# Patient Record
Sex: Female | Born: 1965 | Race: White | Hispanic: No | Marital: Married | Smoking: Never smoker
Health system: Southern US, Community
[De-identification: ages and names within clinical notes are randomized; demographics above are authoritative.]

## PROBLEM LIST (undated history)

## (undated) DIAGNOSIS — K219 Gastro-esophageal reflux disease without esophagitis: Secondary | ICD-10-CM

## (undated) DIAGNOSIS — E039 Hypothyroidism, unspecified: Secondary | ICD-10-CM

## (undated) DIAGNOSIS — F419 Anxiety disorder, unspecified: Secondary | ICD-10-CM

## (undated) DIAGNOSIS — G4730 Sleep apnea, unspecified: Secondary | ICD-10-CM

## (undated) DIAGNOSIS — Z9889 Other specified postprocedural states: Secondary | ICD-10-CM

## (undated) DIAGNOSIS — F32A Depression, unspecified: Secondary | ICD-10-CM

## (undated) DIAGNOSIS — R011 Cardiac murmur, unspecified: Secondary | ICD-10-CM

## (undated) DIAGNOSIS — R112 Nausea with vomiting, unspecified: Secondary | ICD-10-CM

## (undated) DIAGNOSIS — D649 Anemia, unspecified: Secondary | ICD-10-CM

## (undated) DIAGNOSIS — F329 Major depressive disorder, single episode, unspecified: Secondary | ICD-10-CM

## (undated) DIAGNOSIS — I10 Essential (primary) hypertension: Secondary | ICD-10-CM

## (undated) DIAGNOSIS — G5640 Causalgia of unspecified upper limb: Secondary | ICD-10-CM

## (undated) DIAGNOSIS — R51 Headache: Principal | ICD-10-CM

## (undated) HISTORY — PX: ABDOMINOPLASTY: SUR9

## (undated) HISTORY — PX: FRACTURE SURGERY: SHX138

## (undated) HISTORY — PX: OTHER SURGICAL HISTORY: SHX169

## (undated) HISTORY — PX: NOSE SURGERY: SHX723

## (undated) HISTORY — PX: RECTOCELE REPAIR: SHX761

## (undated) HISTORY — PX: CARPAL TUNNEL RELEASE: SHX101

---

## 1998-02-24 ENCOUNTER — Inpatient Hospital Stay (HOSPITAL_COMMUNITY): Admission: RE | Admit: 1998-02-24 | Discharge: 1998-02-24 | Payer: Self-pay | Admitting: Obstetrics & Gynecology

## 1998-03-16 ENCOUNTER — Inpatient Hospital Stay (HOSPITAL_COMMUNITY): Admission: AD | Admit: 1998-03-16 | Discharge: 1998-03-16 | Payer: Self-pay | Admitting: Obstetrics and Gynecology

## 1998-04-30 ENCOUNTER — Other Ambulatory Visit: Admission: RE | Admit: 1998-04-30 | Discharge: 1998-04-30 | Payer: Self-pay | Admitting: *Deleted

## 1998-09-04 ENCOUNTER — Ambulatory Visit (HOSPITAL_COMMUNITY): Admission: RE | Admit: 1998-09-04 | Discharge: 1998-09-04 | Payer: Self-pay | Admitting: Obstetrics and Gynecology

## 1998-09-17 ENCOUNTER — Ambulatory Visit (HOSPITAL_COMMUNITY): Admission: RE | Admit: 1998-09-17 | Discharge: 1998-09-17 | Payer: Self-pay | Admitting: Obstetrics and Gynecology

## 1999-05-17 ENCOUNTER — Inpatient Hospital Stay (HOSPITAL_COMMUNITY): Admission: AD | Admit: 1999-05-17 | Discharge: 1999-05-17 | Payer: Self-pay | Admitting: Obstetrics and Gynecology

## 1999-06-14 ENCOUNTER — Inpatient Hospital Stay (HOSPITAL_COMMUNITY): Admission: AD | Admit: 1999-06-14 | Discharge: 1999-06-17 | Payer: Self-pay | Admitting: Obstetrics and Gynecology

## 1999-06-18 ENCOUNTER — Encounter (HOSPITAL_COMMUNITY): Admission: RE | Admit: 1999-06-18 | Discharge: 1999-09-16 | Payer: Self-pay | Admitting: Obstetrics and Gynecology

## 1999-07-18 ENCOUNTER — Other Ambulatory Visit: Admission: RE | Admit: 1999-07-18 | Discharge: 1999-07-18 | Payer: Self-pay | Admitting: Obstetrics and Gynecology

## 1999-10-03 HISTORY — PX: HERNIA REPAIR: SHX51

## 2000-02-23 ENCOUNTER — Encounter: Payer: Self-pay | Admitting: Specialist

## 2000-02-24 ENCOUNTER — Encounter (INDEPENDENT_AMBULATORY_CARE_PROVIDER_SITE_OTHER): Payer: Self-pay

## 2000-02-24 ENCOUNTER — Ambulatory Visit (HOSPITAL_COMMUNITY): Admission: RE | Admit: 2000-02-24 | Discharge: 2000-02-25 | Payer: Self-pay | Admitting: Specialist

## 2000-07-17 ENCOUNTER — Other Ambulatory Visit: Admission: RE | Admit: 2000-07-17 | Discharge: 2000-07-17 | Payer: Self-pay | Admitting: Obstetrics and Gynecology

## 2000-08-10 ENCOUNTER — Ambulatory Visit (HOSPITAL_BASED_OUTPATIENT_CLINIC_OR_DEPARTMENT_OTHER): Admission: RE | Admit: 2000-08-10 | Discharge: 2000-08-10 | Payer: Self-pay | Admitting: Otolaryngology

## 2001-07-19 ENCOUNTER — Other Ambulatory Visit: Admission: RE | Admit: 2001-07-19 | Discharge: 2001-07-19 | Payer: Self-pay | Admitting: Obstetrics and Gynecology

## 2002-01-16 ENCOUNTER — Observation Stay (HOSPITAL_COMMUNITY): Admission: RE | Admit: 2002-01-16 | Discharge: 2002-01-17 | Payer: Self-pay | Admitting: Obstetrics and Gynecology

## 2002-07-21 ENCOUNTER — Other Ambulatory Visit: Admission: RE | Admit: 2002-07-21 | Discharge: 2002-07-21 | Payer: Self-pay | Admitting: Obstetrics and Gynecology

## 2003-07-24 ENCOUNTER — Other Ambulatory Visit: Admission: RE | Admit: 2003-07-24 | Discharge: 2003-07-24 | Payer: Self-pay | Admitting: Obstetrics and Gynecology

## 2004-08-26 ENCOUNTER — Ambulatory Visit (HOSPITAL_COMMUNITY): Admission: RE | Admit: 2004-08-26 | Discharge: 2004-08-26 | Payer: Self-pay | Admitting: Gastroenterology

## 2005-07-14 ENCOUNTER — Encounter: Admission: RE | Admit: 2005-07-14 | Discharge: 2005-07-14 | Payer: Self-pay | Admitting: Gastroenterology

## 2006-10-04 ENCOUNTER — Encounter: Admission: RE | Admit: 2006-10-04 | Discharge: 2006-10-04 | Payer: Self-pay | Admitting: General Surgery

## 2007-02-19 ENCOUNTER — Encounter: Admission: RE | Admit: 2007-02-19 | Discharge: 2007-02-19 | Payer: Self-pay | Admitting: *Deleted

## 2007-09-13 ENCOUNTER — Encounter: Admission: RE | Admit: 2007-09-13 | Discharge: 2007-09-13 | Payer: Self-pay | Admitting: Family Medicine

## 2008-12-21 ENCOUNTER — Encounter: Admission: RE | Admit: 2008-12-21 | Discharge: 2008-12-21 | Payer: Self-pay | Admitting: Obstetrics and Gynecology

## 2008-12-24 ENCOUNTER — Encounter: Admission: RE | Admit: 2008-12-24 | Discharge: 2008-12-24 | Payer: Self-pay | Admitting: Obstetrics and Gynecology

## 2010-04-12 ENCOUNTER — Encounter: Admission: RE | Admit: 2010-04-12 | Discharge: 2010-04-12 | Payer: Self-pay | Admitting: Obstetrics and Gynecology

## 2011-02-17 NOTE — Op Note (Signed)
[facility]  Patient:    Natalie Camacho, Natalie Camacho Visit Number: [id number] MRN: [id number]          Service Type: DSU Location: 9300 [id number] [id number] Attending Physician:  Lenoard Aden Dictated by:   Lenoard Aden, M.D. Proc. Date: 01/16/02 Admit Date:  01/16/2002   CC:         [facility]   Operative Report  PREOPERATIVE DIAGNOSES:       1. Pelvic relaxation.                               2. Dyschezia.                               3. Rectocele.                               4. Perineal relaxation.  POSTOPERATIVE DIAGNOSES:      1. Pelvic relaxation.                               2. Dyschezia.                               3. Rectocele.                               4. Perineal relaxation.  OPERATION/PROCEDURE:          1. Posterior colporrhaphy.                               2. Perineorrhaphy.  SURGEON:                      Lenoard Aden, M.D.  ASSISTANT:                    Sung Amabile. Roslyn Smiling, M.D.  ANESTHESIA:                   General.  ESTIMATED BLOOD LOSS:         50 cc.  DRAINS:                       Vaginal pack and Foley catheter.  COMPLICATIONS:                None.  DISPOSITION:                  The patient to recovery in good condition.  DESCRIPTION OF PROCEDURE:     After being apprised of the risks of anesthesia, infection and bleeding, possible rectal injury with the need for repair, possible inability to cure the dyschezia and possible dyspareunia, the patient was brought to the operating room where she was administered a general anesthetic without complications and prepped and draped in the usual sterile fashion. After achieving adequate anesthesia, the apex of the rectocele was identified via rectal exam and clamped using Allis clamp. Dilute Pitressin solution was used to infiltrate the posterior vaginal wall and the perineum. A triangular shaped wedge of tissue is made in the perineum  and excised. The rectocele is  undermined creating a rectangular shaped defect in the posterior vaginal wall. This area is then closed using 2-0 Monocryl mattress sutures to collapse the posterior vaginal wall and imbricate the redundant rectal mucosa. After putting multiple interruptives to achieve rectocele closure, the perineorrhaphy is performed in standard fashion using a 3-0 Vicryl Rapide. Good hemostasis is achieved. The patient tolerated the procedure well. Rectal exam performed reveals no evidence of suture material in the rectum and good posterior rectal support. The patient is transported to recovery in good condition. Dictated by:   Lenoard Aden, M.D. Attending Physician:  Lenoard Aden DD:  01/16/02 TD:  01/17/02 Job: [id number]

## 2011-02-17 NOTE — Discharge Summary (Signed)
[facility]  Patient:    Natalie Camacho, Natalie Camacho Visit Number: [id number] MRN: [id number]          Service Type: DSU Location: 9300 [id number] [id number] Attending Physician:  Lenoard Aden Dictated by:   Lenoard Aden, M.D. Admit Date:  01/16/2002 Discharge Date: 01/17/2002                             Discharge Summary  HOSPITAL COURSE:              The patient underwent uncomplicated posterior colporrhaphy and perineorrhaphy on January 16, 2002.  Postoperative course uncomplicated.  Tolerated a regular diet without difficulty.  Hemoglobin 10.9. Discharged to home on day one.  Restrictions were discussed.  Discharge teaching was done.  Follow up in the office in four to six weeks. Dictated by:   Lenoard Aden, M.D. Attending Physician:  Lenoard Aden DD:  03/03/02 TD:  03/05/02 Job: [id number]

## 2011-02-17 NOTE — Op Note (Signed)
[place]. [facility]  Patient:    Natalie Camacho, Natalie Camacho                     MRN: [id number] Proc. Date: 02/24/00 Adm. Date:  [id number] Disc. Date: [id number] Attending:  Gustavus Messing CC:         Yaakov Guthrie. Shon Hough, M.D. x 2                           Operative Report  PREOPERATIVE DIAGNOSIS:  POSTOPERATIVE DIAGNOSIS:  PROCEDURE:  Repair of diastasis and limited abdominoplasty.  SURGEON:  Yaakov Guthrie. Shon Hough, M.D.  ANESTHESIA:  General.  INDICATIONS:  A 45 year old lady with abdominal dermatochalasis of lower abdomen, right and left ischial areas.  DESCRIPTION OF PROCEDURE:  The patient underwent general anesthesia and intubated orally.  A W-plasty incision was made down to underlying skin and subcutaneous tissue, and dissection carried down to umbilicus.  Severe diastasis was observed from the belly button down to the suprapubic area. This repaired back with multiple sutures of #1 Prolene suture.  The area was irrigated with saline and then the patient was placed in the jack knife position.  Excess tissue then removed.  Subcutaneous closure was done with 2-0 Vicryl x 2 layers, and then a running subcuticular stitch of 3-0 Monocryl. The wounds were cleansed, Steri-Strips were applied, and soft dressings.  The wounds were drained with a #10 Blake drain which were placed in the suprapubic area.  After soft dressings were applied, abdominoplasty support was placed.  She withstood the procedures very well and was taken to recovery in excellent condition. DD:  02/24/00 TD:  02/28/00 Job: [id number]

## 2011-02-17 NOTE — Op Note (Signed)
[place]. [facility]  Patient:    Natalie Camacho, Natalie Camacho                     MRN: [id number] Proc. Date: 08/10/00 Adm. Date:  [id number] Attending:  Lucky Cowboy CC:         Ammie Dalton, M.D.   Operative Report  PREOPERATIVE DIAGNOSIS:  Bilateral inferior turbinate hypertrophy.  POSTOPERATIVE DIAGNOSIS:  Bilateral inferior turbinate hypertrophy.  PROCEDURE:  Bilateral inferior turbinate reductions.  SURGEON:  Lucky Cowboy, M.D.  ANESTHESIA:  General endotracheal anesthesia.  ESTIMATED BLOOD LOSS:  30 cc.  SPECIMENS:  None.  COMPLICATIONS:  None.  INDICATIONS:  This patient is a 45 year old female who has tried medical therapy, including nasal steroid spray for nasal obstruction.  The patient was noted to have significant bilateral inferior turbinate hypertrophy.  This failed to improve with medical therapy, and for this reason the turbinate reductions are performed.  FINDINGS:  The patient was noted to have perfuse primarily mucosal inferior turbinate hypertrophy with some bony component.  There was a left anterior inferior septal deviation along the maxillary crest.  Nasal patency was achieved after reducing the turbinate, despite this deflection.  DESCRIPTION OF PROCEDURE:  The patient was taken to the operating room and placed on the table in the supine position.  She was then placed under general endotracheal anesthesia, and the septal cavity decongested with Afrin and lidocaine, and 1% lidocaine with 1:100,000 epinephrine was then used to inject both inferior turbinates.  The #15 blade was used to incise the inferior portion of the inferior turbinate after allowing time for topical and injection anesthesia and epinephrine effect.  Flaps were elevated medially and laterally, and bone was resected using the _______-Cut forceps.  The sinus scissors were then used to reduce the redundant mucosa.  Suction cautery was used for hemostasis.  The  right turbinate was removed in an identical fashion. The nasal cavity was then suctioned out.  A 0-degree _____ Hopkins endoscope was used for the procedure.  Each nasal cavity was then packed with a Merocel pack coated with Bactroban ointment.  The oral cavity was suctioned.  Please note that the procedure was performed with the table rotated counter clockwise 90 degrees.  Also note that the nose was prepped with Betadine and the patient draped in a sterile fashion prior to proceeding with the procedure.  The patient was awakened from anesthesia and extubated in the operating room. She was taken to the post anesthesia care unit in stable condition.  There were no complications. DD:  08/10/00 TD:  08/11/00 Job: [id number]

## 2011-02-17 NOTE — Op Note (Signed)
[place]. [facility]  Patient:    Natalie Camacho, Natalie Camacho                     MRN: [id number] Proc. Date: 02/24/00 Adm. Date:  [id number] Disc. Date: [id number] Attending:  Gustavus Messing CC:         Yaakov Guthrie. Shon Hough, M.D. x 2                           Operative Report  PREOPERATIVE DIAGNOSIS:  POSTOPERATIVE DIAGNOSIS:  PROCEDURE:  Exploration and repair of severe diastasis, as well as ventral hernia repair.  SURGEON:  Yaakov Guthrie. Shon Hough, M.D.  ANESTHESIA:  General.  INDICATIONS:  This is a 45 year old lady who is status post previous fundoplication for GERD.  She has done fairly well since that period of time, but now has developed in the upper abdomen, above the umbilicus, a bulge area with increased discomfort.  They are transient up towards the xiphoid process as well.  DESCRIPTION OF PROCEDURE:  Preoperatively, the patient was drawn for the area. She underwent general anesthesia and intubated orally.  Prep was done to the abdominal area and groin using Betadine soap and solution, and walled off with sterile towels and drapes so as to make a sterile field.  The patient also had severe scar cicatrix involving the midline of the abdomen from the umbilicus to the xiphoid that is going to be revised.  The scar was excised down to underlying subcutaneous tissue, down to underlying fascia.  Dissection was then carried out laterally on each side to expose the area.  The bulge area measured approximately 6 x 3 inches that was very obvious.  Also had increased diastasis all the way to the xiphoid process.  A piece of Prolene suture was placed over the defect for repair, and then multiple sutures of 2-0 Surgilon were used to repair the diastasis, as well as the herniation.  Irrigation was done with bug juice.  The incisions were then brought back together with 2-0 Monocryl x 2 layers in the subcutaneous planes, and a running subcuticular stitch of 3-0  Monocryl.  The wounds were drained with a #10 Blake drain which was placed in the depth of the wound and brought out through the umbilical area and secured with 3-0 Prolene.  Steri-Strips and a soft dressing were applied to all the areas.  She withstood the procedures very well.  Will put her in overnight stay for observation. DD:  02/24/00 TD:  02/28/00 Job: [id number]

## 2011-02-17 NOTE — H&P (Signed)
[facility]  Patient:    Natalie Camacho, Natalie Camacho Visit Number: [id number] MRN: [id number]          Service Type: DSU Location: 9300 9399 [id number] Attending Physician:  Lenoard Aden Dictated by:   Lenoard Aden, M.D. Admit Date:  01/16/2002                           History and Physical  CHIEF COMPLAINT:              Symptomatic pelvic relaxation.  HISTORY OF PRESENT ILLNESS:   The patient is a 45 year old white female, G1, P1, who presents with symptomatic pelvic pressure and dyschezia.  MEDICATIONS:                  Synthroid, Advair, Paxil, Wellbutrin, Singulair, multivitamin, Nexium, and Imitrex.  ALLERGIES:                    ASPIRIN, ERYTHROMYCIN, and CODEINE.  SOCIAL HISTORY:               She is a nonsmoker, nondrinker.  Denies domestic or physical violence.  PAST MEDICAL HISTORY:         Medical problems to include asthma, migraine headaches, premenstrual dysphoria, hypothyroidism, and alopecia.  PHYSICAL EXAMINATION:  GENERAL:                      She is a well-developed, well-nourished white female in no apparent distress.  HEENT:                        Normal.  CHEST:                        Lungs clear.  CARDIAC:                      Regular rate and rhythm.  ABDOMEN:                      Soft, nontender.  BREASTS:                      Patient is status post mammary augmentation and abdominoplasty.  PELVIC:                       The perineum reveals a small but asymptomatic cystocele, grade 1-2 rectocele, with perineal relaxation.  The uterus is well-supported.  No adnexal masses.  IMPRESSION:                   Symptomatic pelvic relaxation.  PLAN:                         Proceed with rectocele repair, perineorrhaphy. Risks of recurrence discussed.  Risks of anesthesia, infection, bleeding, injury to rectum with need to repair were discussed.  Patient acknowledges and desires to proceed.  The possibility of dyspareunia  postoperatively discussed. The patient acknowledges and desires to proceed. Dictated by:   Lenoard Aden, M.D. Attending Physician:  Lenoard Aden DD:  01/16/02 TD:  01/16/02 Job: [id number]

## 2011-02-17 NOTE — Discharge Summary (Signed)
[facility]  Patient:    Natalie Camacho, Natalie Camacho Visit Number: [id number] MRN: [id number]          Service Type: DSU Location: [phone] Attending Physician:  Lenoard Aden Dictated by:   Lenoard Aden, M.D. Admit Date:  01/16/2002 Discharge Date: 01/17/2002                             Discharge Summary  SUMMARY:                      The patient underwent an uncomplicated posterior colporrhaphy and perineorrhaphy on January 16, 2002. She tolerated regular diet well. Hemoglobin and hematocrit were within normal limits. She was discharged to home on postoperative day #1.  DISCHARGE MEDICATIONS:        1. Percocet, #20, given for pain. Take one                                  p.o. q.6h.                               2. Patient to continue MiraLax to avoid                                  constipation.  WOUND CARE:                   Wound care discussed.  DISCHARGE INSTRUCTIONS:       Discharge teaching done.  DISCHARGE FOLLOWUP:           The patient is to follow up in the office in two weeks. Dictated by:   Lenoard Aden, M.D. Attending Physician:  Lenoard Aden DD:  01/17/02 TD:  01/18/02 Job: [id number]

## 2011-05-29 ENCOUNTER — Other Ambulatory Visit: Payer: Self-pay | Admitting: Obstetrics and Gynecology

## 2011-05-31 ENCOUNTER — Other Ambulatory Visit: Payer: Self-pay | Admitting: Obstetrics and Gynecology

## 2011-06-12 ENCOUNTER — Other Ambulatory Visit: Payer: Self-pay

## 2011-06-12 ENCOUNTER — Encounter (HOSPITAL_COMMUNITY): Payer: Self-pay

## 2011-06-12 ENCOUNTER — Encounter (HOSPITAL_COMMUNITY)
Admission: RE | Admit: 2011-06-12 | Discharge: 2011-06-12 | Disposition: A | Discharge status: Home / Self Care | Payer: 59 | Source: Ambulatory Visit | Attending: Obstetrics and Gynecology | Admitting: Obstetrics and Gynecology

## 2011-06-12 HISTORY — DX: Essential (primary) hypertension: I10

## 2011-06-12 HISTORY — DX: Anemia, unspecified: D64.9

## 2011-06-12 HISTORY — DX: Hypothyroidism, unspecified: E03.9

## 2011-06-12 HISTORY — DX: Sleep apnea, unspecified: G47.30

## 2011-06-12 HISTORY — DX: Major depressive disorder, single episode, unspecified: F32.9

## 2011-06-12 HISTORY — DX: Other specified postprocedural states: Z98.890

## 2011-06-12 HISTORY — DX: Depression, unspecified: F32.A

## 2011-06-12 HISTORY — DX: Cardiac murmur, unspecified: R01.1

## 2011-06-12 HISTORY — DX: Anxiety disorder, unspecified: F41.9

## 2011-06-12 HISTORY — DX: Gastro-esophageal reflux disease without esophagitis: K21.9

## 2011-06-12 HISTORY — DX: Nausea with vomiting, unspecified: R11.2

## 2011-06-12 LAB — CBC
HCT: 37.6 % (ref 36.0–46.0)
MCH: 26.8 pg (ref 26.0–34.0)
MCHC: 31.9 g/dL (ref 30.0–36.0)
Platelets: 408 10*3/uL — ABNORMAL HIGH (ref 150–400)
RDW: 13.5 % (ref 11.5–15.5)

## 2011-06-12 LAB — BASIC METABOLIC PANEL
BUN: 12 mg/dL (ref 6–23)
CO2: 32 mEq/L (ref 19–32)
Calcium: 9.3 mg/dL (ref 8.4–10.5)
Chloride: 98 mEq/L (ref 96–112)
Creatinine, Ser: 0.98 mg/dL (ref 0.50–1.10)
Potassium: 3 mEq/L — ABNORMAL LOW (ref 3.5–5.1)
Sodium: 138 mEq/L (ref 135–145)

## 2011-06-12 NOTE — Patient Instructions (Signed)
20 Natalie Camacho [facility]  06/12/2011   Your procedure is scheduled on:  06/16/11  Report to [facility] at 1:15 PM.  Call this number if you have problems the morning of surgery: [phone]   Remember:   Do not eat food:After Midnight.  Do not drink clear liquids after 1030am on Fri  Take these medicines the morning of surgery with A SIP OF WATER: all am meds   Do not wear jewelry, make-up or nail polish.  Do not wear lotions, powders, or perfumes. You may wear deodorant.  Do not shave 48 hours prior to surgery.  Do not bring valuables to the hospital.  Contacts, dentures or bridgework may not be worn into surgery.  Leave suitcase in the car. After surgery it may be brought to your room.  For patients admitted to the hospital, checkout time is 11:00 AM the day of discharge.   Patients discharged the day of surgery will not be allowed to drive home.  Name and phone number of your driver: motherCorrie Dandy- [phone]   Special Instructions: CHG Shower Use Special Wash: 1/2 bottle night before surgery and 1/2 bottle morning of surgery.   Please read over the following fact sheets that you were given:

## 2011-06-13 NOTE — Pre-Procedure Instructions (Signed)
K+ result of 3.0 reported to Dr. Sheral Apley, who said to instruct pt to eat bananas daily until surgery. I phoned pt and she is allergic to bananas so I suggested orange juice and pt will look online for other options.

## 2011-06-16 ENCOUNTER — Ambulatory Visit (HOSPITAL_COMMUNITY)
Admission: RE | Admit: 2011-06-16 | Discharge: 2011-06-16 | Disposition: A | Discharge status: Home / Self Care | Payer: 59 | Source: Ambulatory Visit | Attending: Obstetrics and Gynecology | Admitting: Obstetrics and Gynecology

## 2011-06-16 ENCOUNTER — Other Ambulatory Visit: Payer: Self-pay | Admitting: Obstetrics and Gynecology

## 2011-06-16 ENCOUNTER — Encounter (HOSPITAL_COMMUNITY)
Admission: RE | Disposition: A | Discharge status: Home / Self Care | Payer: Self-pay | Source: Ambulatory Visit | Attending: Obstetrics and Gynecology

## 2011-06-16 ENCOUNTER — Encounter (HOSPITAL_COMMUNITY): Payer: Self-pay

## 2011-06-16 ENCOUNTER — Ambulatory Visit (HOSPITAL_COMMUNITY): Payer: 59 | Admitting: Anesthesiology

## 2011-06-16 ENCOUNTER — Encounter (HOSPITAL_COMMUNITY): Payer: Self-pay | Admitting: Anesthesiology

## 2011-06-16 DIAGNOSIS — Z01818 Encounter for other preprocedural examination: Secondary | ICD-10-CM | POA: Insufficient documentation

## 2011-06-16 DIAGNOSIS — N921 Excessive and frequent menstruation with irregular cycle: Secondary | ICD-10-CM

## 2011-06-16 DIAGNOSIS — N938 Other specified abnormal uterine and vaginal bleeding: Secondary | ICD-10-CM | POA: Insufficient documentation

## 2011-06-16 DIAGNOSIS — N949 Unspecified condition associated with female genital organs and menstrual cycle: Secondary | ICD-10-CM | POA: Insufficient documentation

## 2011-06-16 DIAGNOSIS — Z01812 Encounter for preprocedural laboratory examination: Secondary | ICD-10-CM | POA: Insufficient documentation

## 2011-06-16 SURGERY — DILATATION & CURETTAGE/HYSTEROSCOPY WITH NOVASURE ABLATION
Anesthesia: Choice | Wound class: Clean Contaminated

## 2011-06-16 MED ORDER — LIDOCAINE HCL (CARDIAC) 20 MG/ML IV SOLN
INTRAVENOUS | Status: DC | PRN
  Administered 2011-06-16: 80 mg via INTRAVENOUS

## 2011-06-16 MED ORDER — OXYCODONE-ACETAMINOPHEN 5-325 MG PO TABS
ORAL_TABLET | ORAL | Status: AC
  Filled 2011-06-16: qty 2

## 2011-06-16 MED ORDER — FENTANYL CITRATE 0.05 MG/ML IJ SOLN
25.0000 ug | INTRAMUSCULAR | Status: DC | PRN
  Administered 2011-06-16: 50 ug via INTRAVENOUS

## 2011-06-16 MED ORDER — LIDOCAINE HCL (CARDIAC) 20 MG/ML IV SOLN
INTRAVENOUS | Status: AC
  Filled 2011-06-16: qty 5

## 2011-06-16 MED ORDER — BUPIVACAINE HCL (PF) 0.25 % IJ SOLN
INTRAMUSCULAR | Status: DC | PRN
  Administered 2011-06-16: 20 mL

## 2011-06-16 MED ORDER — LACTATED RINGERS IV SOLN
INTRAVENOUS | Status: DC
Start: 2011-06-16 — End: 2011-06-16
  Administered 2011-06-16: 1000 mL via INTRAVENOUS
  Administered 2011-06-16 (×2): via INTRAVENOUS

## 2011-06-16 MED ORDER — LACTATED RINGERS IR SOLN: Status: DC | PRN

## 2011-06-16 MED ORDER — FENTANYL CITRATE 0.05 MG/ML IJ SOLN
INTRAMUSCULAR | Status: AC
  Filled 2011-06-16: qty 5

## 2011-06-16 MED ORDER — ONDANSETRON HCL 4 MG/2ML IJ SOLN
INTRAMUSCULAR | Status: AC
  Filled 2011-06-16: qty 2

## 2011-06-16 MED ORDER — PROPOFOL 10 MG/ML IV EMUL
INTRAVENOUS | Status: AC
  Filled 2011-06-16: qty 20

## 2011-06-16 MED ORDER — OXYCODONE-ACETAMINOPHEN 5-325 MG PO TABS
2.0000 | ORAL_TABLET | Freq: Once | ORAL | Status: AC
  Administered 2011-06-16: 2 via ORAL

## 2011-06-16 MED ORDER — SCOPOLAMINE 1 MG/3DAYS TD PT72
MEDICATED_PATCH | TRANSDERMAL | Status: AC
  Administered 2011-06-16: 1.5 mg via TRANSDERMAL
  Filled 2011-06-16: qty 1

## 2011-06-16 MED ORDER — ALBUTEROL SULFATE HFA 108 (90 BASE) MCG/ACT IN AERS
2.0000 | INHALATION_SPRAY | RESPIRATORY_TRACT | Status: DC | PRN
  Administered 2011-06-16: 2 via RESPIRATORY_TRACT
  Filled 2011-06-16: qty 6.7

## 2011-06-16 MED ORDER — FENTANYL CITRATE 0.05 MG/ML IJ SOLN
INTRAMUSCULAR | Status: DC | PRN
  Administered 2011-06-16: 100 ug via INTRAVENOUS
  Administered 2011-06-16: 50 ug via INTRAVENOUS

## 2011-06-16 MED ORDER — ALBUTEROL SULFATE HFA 108 (90 BASE) MCG/ACT IN AERS
INHALATION_SPRAY | RESPIRATORY_TRACT | Status: AC
  Administered 2011-06-16: 2 via RESPIRATORY_TRACT
  Filled 2011-06-16: qty 6.7

## 2011-06-16 MED ORDER — FENTANYL CITRATE 0.05 MG/ML IJ SOLN
INTRAMUSCULAR | Status: AC
  Administered 2011-06-16: 50 ug via INTRAVENOUS
  Filled 2011-06-16: qty 2

## 2011-06-16 MED ORDER — ALBUTEROL SULFATE HFA 108 (90 BASE) MCG/ACT IN AERS: 2.0000 | INHALATION_SPRAY | RESPIRATORY_TRACT | Status: DC | PRN

## 2011-06-16 MED ORDER — OXYCODONE-ACETAMINOPHEN 5-325 MG PO TABS: 1.0000 | ORAL_TABLET | ORAL | Status: DC | PRN

## 2011-06-16 MED ORDER — PROPOFOL 10 MG/ML IV EMUL
INTRAVENOUS | Status: DC | PRN
  Administered 2011-06-16: 200 mg via INTRAVENOUS
  Administered 2011-06-16: 100 mg via INTRAVENOUS

## 2011-06-16 MED ORDER — SCOPOLAMINE 1 MG/3DAYS TD PT72
1.0000 | MEDICATED_PATCH | TRANSDERMAL | Status: DC
  Administered 2011-06-16: 1.5 mg via TRANSDERMAL

## 2011-06-16 MED ORDER — ONDANSETRON HCL 4 MG/2ML IJ SOLN
INTRAMUSCULAR | Status: DC | PRN
  Administered 2011-06-16: 4 mg via INTRAVENOUS

## 2011-06-16 MED ORDER — OXYCODONE-ACETAMINOPHEN 5-325 MG PO TABS: 1.0000 | ORAL_TABLET | ORAL | Status: AC | PRN

## 2011-06-16 MED ORDER — MIDAZOLAM HCL 5 MG/5ML IJ SOLN
INTRAMUSCULAR | Status: DC | PRN
  Administered 2011-06-16: 2 mg via INTRAVENOUS

## 2011-06-16 MED ORDER — MIDAZOLAM HCL 2 MG/2ML IJ SOLN
INTRAMUSCULAR | Status: AC
  Filled 2011-06-16: qty 2

## 2011-06-16 SURGICAL SUPPLY — 14 items
ABLATOR ENDOMETRIAL BIPOLAR (ABLATOR) ×2 IMPLANT
CATH ROBINSON RED A/P 16FR (CATHETERS) ×2 IMPLANT
CLOTH BEACON ORANGE TIMEOUT ST (SAFETY) ×2 IMPLANT
CONTAINER PREFILL 10% NBF 60ML (FORM) ×4 IMPLANT
GLOVE BIO SURGEON STRL SZ7.5 (GLOVE) ×4 IMPLANT
GOWN PREVENTION PLUS LG XLONG (DISPOSABLE) ×2 IMPLANT
GOWN PREVENTION PLUS XLARGE (GOWN DISPOSABLE) ×2 IMPLANT
NDL SPNL 22GX3.5 QUINCKE BK (NEEDLE) ×1 IMPLANT
NEEDLE SPNL 22GX3.5 QUINCKE BK (NEEDLE) ×2 IMPLANT
PACK HYSTEROSCOPY LF (CUSTOM PROCEDURE TRAY) ×2 IMPLANT
PAD PREP 24X48 CUFFED NSTRL (MISCELLANEOUS) ×2 IMPLANT
SYR TB 1ML 25GX5/8 (SYRINGE) ×2 IMPLANT
TOWEL OR 17X24 6PK STRL BLUE (TOWEL DISPOSABLE) ×4 IMPLANT
WATER STERILE IRR 1000ML POUR (IV SOLUTION) ×2 IMPLANT

## 2011-06-16 NOTE — Transfer of Care (Signed)
Immediate Anesthesia Transfer of Care Note  Patient: Natalie Camacho  Procedure(s) Performed:  DILATATION & CURETTAGE/HYSTEROSCOPY WITH NOVASURE ABLATION  Patient Location: PACU  Anesthesia Type: General  Level of Consciousness: awake, alert  and oriented  Airway & Oxygen Therapy: Patient Spontanous Breathing and Patient connected to nasal cannula oxygen  Post-op Assessment: Report given to PACU RN  Post vital signs: stable  Complications: No apparent anesthesia complications

## 2011-06-16 NOTE — Anesthesia Preprocedure Evaluation (Addendum)
Anesthesia Evaluation  Name, MR# and DOB Patient awake  General Assessment Comment  Reviewed: Allergy & Precautions, H&P , Patient's Chart, lab work & pertinent test results, reviewed documented beta blocker date and time   History of Anesthesia Complications (+) PONV  Airway Mallampati: II TM Distance: >3 FB Neck ROM: full    Dental No notable dental hx.    Pulmonary  asthmasleep apnea  clear to auscultation  pulmonary exam normalPulmonary Exam Normal breath sounds clear to auscultation none    Cardiovascular Exercise Tolerance: Good hypertension, - Valvular Problems/Murmursregular Normal    Neuro/Psych    (+) PSYCHIATRIC DISORDERS, Anxiety, Depression,  Negative Neurological ROS  Negative Psych ROS  GI/Hepatic/Renal negative GI ROS  negative Liver ROS  negative Renal ROS   GERD Controlled     Endo/Other  Negative Endocrine ROS (+) Hypothyroidism,      Abdominal   Musculoskeletal   Hematology negative hematology ROS (+)   Peds  Reproductive/Obstetrics negative OB ROS    Anesthesia Other Findings            Anesthesia Physical Anesthesia Plan  ASA: III  Anesthesia Plan: General   Post-op Pain Management:    Induction:   Airway Management Planned: LMA  Additional Equipment:   Intra-op Plan:   Post-operative Plan:   Informed Consent: I have reviewed the patients History and Physical, chart, labs and discussed the procedure including the risks, benefits and alternatives for the proposed anesthesia with the patient or authorized representative who has indicated his/her understanding and acceptance.   Dental Advisory Given  Plan Discussed with: CRNA and Surgeon  Anesthesia Plan Comments:         Anesthesia Quick Evaluation

## 2011-06-16 NOTE — Op Note (Signed)
06/16/2011  2:58 PM  PATIENT:  Natalie Camacho  45 y.o. female  PRE-OPERATIVE DIAGNOSIS:  Menorrhagia  POST-OPERATIVE DIAGNOSIS:  Menorrhagia(Refractory)  PROCEDURE:  Procedure(s): DILATATION & CURETTAGE/HYSTEROSCOPY WITH NOVASURE ABLATION  SURGEON:  Surgeon(s): Lenoard Aden, MD  PHYSICIAN ASSISTANT:none   ASSISTANTS: none   ANESTHESIA:   local and general  ESTIMATED BLOOD LOSS: * No blood loss amount entered *   BLOOD ADMINISTERED:none  DRAINS: none   LOCAL MEDICATIONS USED:  MARCAINE 22CC  SPECIMEN:  Source of Specimen:  [facility]  DISPOSITION OF SPECIMEN:  PATHOLOGY  COUNTS:  YES  TOURNIQUET:  * No tourniquets in log *  DICTATION #: [id number]  PLAN OF CARE: DC home  PATIENT DISPOSITION:  PACU - hemodynamically stable.

## 2011-06-16 NOTE — Progress Notes (Signed)
  No changes noted. H&P dictated. 

## 2011-06-16 NOTE — Anesthesia Postprocedure Evaluation (Signed)
Anesthesia Post Note  Patient: Natalie Camacho [facility]  Procedure(s) Performed:  DILATATION & CURETTAGE/HYSTEROSCOPY WITH NOVASURE ABLATION  Anesthesia type: General  Patient location: PACU  Post pain: Pain level controlled  Post assessment: Post-op Vital signs reviewed  Last Vitals:  Filed Vitals:   06/16/11 1538  BP:   Pulse: 74  Temp:   Resp: 19    Post vital signs: Reviewed  Level of consciousness: sedated  Complications: No apparent anesthesia complications

## 2011-06-16 NOTE — H&P (Signed)
Natalie Camacho, Natalie Camacho              ACCOUNT NO.:  [id number]  MEDICAL RECORD NO.:  [id number]  LOCATION:                                 FACILITY:  PHYSICIAN:  Lenoard Aden, M.D.DATE OF BIRTH:  25-Jan-1966  DATE OF ADMISSION:  06/16/2011 DATE OF DISCHARGE:                             HISTORY & PHYSICAL   CHIEF COMPLAINT:  Dysfunctional uterine bleeding with no obvious structural lesions for definitive therapy.  HISTORY OF PRESENT ILLNESS:  A 45 year old white female G3 P1, who presents for evaluation of dysfunctional uterine bleeding with negative endometrial biopsy, normal labs, and also for definitive therapy.  ALLERGIES:  She has allergies to AUGMENTIN, ASPIRIN, CODEINE, and ERYTHROMYCIN.  Her medications include Valtrex as needed, Cymbalta, Vicodin as needed, triamterene, hydrochlorothiazide, Lasix, calcium, Protonix, ReQuip, Ambien, albuterol, Imitrex, Flexeril, ketoprofen, Synthroid, Neurontin, bupropion.  FAMILY HISTORY:  Hypertension, seizure disorder, lung cancer, COPD, heart disease, and Alzheimer disease.  She has a previous history of a vaginal delivery.  SURGICAL HISTORY:  Remarkable performed in May heart embolization in 2005, rectocele repair, abdominoplasty, carpal tunnel release.  PHYSICAL EXAMINATION:  GENERAL:  She is a well-developed, well- nourished, white female in no acute distress. HEENT:  Normal. NECK:  Supple.  Full range of motion. LUNGS:  Clear. HEART:  Regular rate and rhythm. ABDOMEN:  Soft and nontender. PELVIC:  Anteflexed uterus noted.  No adnexal masses.  IMPRESSION:  Dysfunctional uterine bleeding with negative workup for definitive therapy.  PLAN:  Proceed with diagnostic hysteroscopy, D and C with NovaSure endometrial ablation.  Risks of anesthesia, infection, bleeding, injury to abdominal organs and need for repair is discussed, delayed versus immediate complications to include bowel and bladder injury were notified.  The  patient acknowledges and wishes to proceed.     Lenoard Aden, M.D.     RJT/MEDQ  D:  06/15/2011  T:  06/15/2011  Job:  [id number]

## 2011-06-17 NOTE — Op Note (Signed)
Natalie Camacho, ZILBERMAN              ACCOUNT NO.:  [id number]  MEDICAL RECORD NO.:  [id number]  LOCATION:  [facility]                          FACILITY:  [facility]  PHYSICIAN:  Lenoard Aden, M.D.DATE OF BIRTH:  [birth year]  DATE OF PROCEDURE:  06/16/2011 DATE OF DISCHARGE:  06/16/2011                              OPERATIVE REPORT   DESCRIPTION OF PROCEDURE:  After being apprised of the risks of anesthesia, infection, bleeding, injury to abdominal organs and need for repair delayed versus immediate complications to include bowel and bladder injury, possible need for repair, inability to cure all bleeding, the patient was brought to the operating room.  She was administered general anesthetic without complications, prepped and draped in usual sterile fashion, catheterized till the bladder was empty.  Exam under anesthesia revealed a small mid positioned uterus and no adnexal masses at this time.  The speculum was placed.  Dilute Marcaine solution 22 mL total in a standard paracervical block.  Cervix easily dilated up to a #23 Pratt dilator.  Hysteroscope was placed. Visualization revealed a normal endometrial cavity with bilateral normal tubal ostia.  At this time endometrial curettings were collected in an usual four-quadrant method with a sharp curettage and minimal bleeding was noted.  The NovaSure device measurements were taken and the NovaSure was placed and seated in the appropriate fashion to a width of 4.4 and a length of 6.5.  The procedure was initiated after negative CO2 test for 70 seconds to a power of 157 watts.  At the termination of the procedure, the NovaSure device was removed, inspected and found to be without defects.  The endometrial cavity was also reinspected, defined to be well ablated and without evidence of endometrial or uterine perforation.  At this time the procedure was terminated, fluid deficit of 50 mL was noted.  The patient tolerated the procedure well and  was transferred to recovery in good condition.     Lenoard Aden, M.D.     RJT/MEDQ  D:  06/16/2011  T:  06/17/2011  Job:  [id number]

## 2011-08-18 ENCOUNTER — Other Ambulatory Visit: Payer: Self-pay | Admitting: Obstetrics and Gynecology

## 2011-08-18 DIAGNOSIS — Z1231 Encounter for screening mammogram for malignant neoplasm of breast: Secondary | ICD-10-CM

## 2011-09-19 ENCOUNTER — Ambulatory Visit
Admission: RE | Admit: 2011-09-19 | Discharge: 2011-09-19 | Disposition: A | Discharge status: Home / Self Care | Payer: 59 | Source: Ambulatory Visit | Attending: Obstetrics and Gynecology | Admitting: Obstetrics and Gynecology

## 2011-09-19 DIAGNOSIS — Z1231 Encounter for screening mammogram for malignant neoplasm of breast: Secondary | ICD-10-CM

## 2012-09-04 ENCOUNTER — Other Ambulatory Visit: Payer: Self-pay | Admitting: Obstetrics and Gynecology

## 2012-09-04 DIAGNOSIS — Z1231 Encounter for screening mammogram for malignant neoplasm of breast: Secondary | ICD-10-CM

## 2012-10-10 ENCOUNTER — Ambulatory Visit
Admission: RE | Admit: 2012-10-10 | Discharge: 2012-10-10 | Disposition: A | Discharge status: Home / Self Care | Payer: 59 | Source: Ambulatory Visit | Attending: Obstetrics and Gynecology | Admitting: Obstetrics and Gynecology

## 2012-10-10 DIAGNOSIS — Z1231 Encounter for screening mammogram for malignant neoplasm of breast: Secondary | ICD-10-CM

## 2012-11-26 ENCOUNTER — Encounter (HOSPITAL_COMMUNITY): Payer: Self-pay | Admitting: Emergency Medicine

## 2012-11-26 ENCOUNTER — Emergency Department (HOSPITAL_COMMUNITY): Payer: 59

## 2012-11-26 ENCOUNTER — Observation Stay (HOSPITAL_COMMUNITY)
Admission: EM | Admit: 2012-11-26 | Discharge: 2012-11-28 | Disposition: A | Discharge status: Home / Self Care | Payer: 59 | Attending: Internal Medicine | Admitting: Internal Medicine

## 2012-11-26 DIAGNOSIS — R51 Headache: Principal | ICD-10-CM | POA: Insufficient documentation

## 2012-11-26 DIAGNOSIS — E039 Hypothyroidism, unspecified: Secondary | ICD-10-CM | POA: Insufficient documentation

## 2012-11-26 DIAGNOSIS — I10 Essential (primary) hypertension: Secondary | ICD-10-CM | POA: Insufficient documentation

## 2012-11-26 DIAGNOSIS — F3289 Other specified depressive episodes: Secondary | ICD-10-CM | POA: Insufficient documentation

## 2012-11-26 DIAGNOSIS — E876 Hypokalemia: Secondary | ICD-10-CM | POA: Insufficient documentation

## 2012-11-26 DIAGNOSIS — Z23 Encounter for immunization: Secondary | ICD-10-CM | POA: Insufficient documentation

## 2012-11-26 DIAGNOSIS — F411 Generalized anxiety disorder: Secondary | ICD-10-CM | POA: Insufficient documentation

## 2012-11-26 DIAGNOSIS — Z79899 Other long term (current) drug therapy: Secondary | ICD-10-CM | POA: Insufficient documentation

## 2012-11-26 DIAGNOSIS — F329 Major depressive disorder, single episode, unspecified: Secondary | ICD-10-CM | POA: Insufficient documentation

## 2012-11-26 DIAGNOSIS — R197 Diarrhea, unspecified: Secondary | ICD-10-CM | POA: Insufficient documentation

## 2012-11-26 DIAGNOSIS — R195 Other fecal abnormalities: Secondary | ICD-10-CM

## 2012-11-26 DIAGNOSIS — R519 Headache, unspecified: Secondary | ICD-10-CM | POA: Diagnosis present

## 2012-11-26 DIAGNOSIS — K219 Gastro-esophageal reflux disease without esophagitis: Secondary | ICD-10-CM | POA: Insufficient documentation

## 2012-11-26 DIAGNOSIS — J45909 Unspecified asthma, uncomplicated: Secondary | ICD-10-CM | POA: Insufficient documentation

## 2012-11-26 HISTORY — DX: Headache: R51

## 2012-11-26 LAB — CBC WITH DIFFERENTIAL/PLATELET
Basophils Absolute: 0.1 10*3/uL (ref 0.0–0.1)
Basophils Relative: 0 % (ref 0–1)
HCT: 38.2 % (ref 36.0–46.0)
Hemoglobin: 13.4 g/dL (ref 12.0–15.0)
Lymphocytes Relative: 33 % (ref 12–46)
Monocytes Absolute: 1.1 10*3/uL — ABNORMAL HIGH (ref 0.1–1.0)
Monocytes Relative: 7 % (ref 3–12)
Neutro Abs: 9.5 10*3/uL — ABNORMAL HIGH (ref 1.7–7.7)
Neutrophils Relative %: 58 % (ref 43–77)
WBC: 16.2 10*3/uL — ABNORMAL HIGH (ref 4.0–10.5)

## 2012-11-26 LAB — BASIC METABOLIC PANEL
BUN: 16 mg/dL (ref 6–23)
CO2: 30 mEq/L (ref 19–32)
Chloride: 94 mEq/L — ABNORMAL LOW (ref 96–112)
Creatinine, Ser: 0.91 mg/dL (ref 0.50–1.10)
GFR calc Af Amer: 87 mL/min — ABNORMAL LOW (ref >90)
Potassium: 2 mEq/L — CL (ref 3.5–5.1)

## 2012-11-26 LAB — MAGNESIUM: Magnesium: 1.6 mg/dL (ref 1.5–2.5)

## 2012-11-26 MED ORDER — IOHEXOL 350 MG/ML SOLN
50.0000 mL | Freq: Once | INTRAVENOUS | Status: AC | PRN
  Administered 2012-11-26: 50 mL via INTRAVENOUS

## 2012-11-26 MED ORDER — SODIUM CHLORIDE 0.9 % IV BOLUS (SEPSIS)
1000.0000 mL | Freq: Once | INTRAVENOUS | Status: AC
  Administered 2012-11-26: 1000 mL via INTRAVENOUS

## 2012-11-26 MED ORDER — ACETAMINOPHEN 325 MG PO TABS
650.0000 mg | ORAL_TABLET | Freq: Once | ORAL | Status: AC
  Administered 2012-11-26: 650 mg via ORAL
  Filled 2012-11-26: qty 2

## 2012-11-26 MED ORDER — DIPHENHYDRAMINE HCL 50 MG/ML IJ SOLN
25.0000 mg | Freq: Once | INTRAMUSCULAR | Status: AC
  Administered 2012-11-26: 25 mg via INTRAVENOUS
  Filled 2012-11-26: qty 1

## 2012-11-26 MED ORDER — POTASSIUM CHLORIDE 10 MEQ/100ML IV SOLN
10.0000 meq | Freq: Once | INTRAVENOUS | Status: AC
  Administered 2012-11-26: 10 meq via INTRAVENOUS
  Filled 2012-11-26: qty 100

## 2012-11-26 MED ORDER — DEXAMETHASONE SODIUM PHOSPHATE 10 MG/ML IJ SOLN
10.0000 mg | Freq: Once | INTRAMUSCULAR | Status: AC
  Administered 2012-11-26: 10 mg via INTRAVENOUS
  Filled 2012-11-26: qty 1

## 2012-11-26 MED ORDER — KETOROLAC TROMETHAMINE 30 MG/ML IJ SOLN
30.0000 mg | Freq: Once | INTRAMUSCULAR | Status: AC
  Administered 2012-11-26: 30 mg via INTRAVENOUS
  Filled 2012-11-26: qty 1

## 2012-11-26 MED ORDER — POTASSIUM CHLORIDE CRYS ER 20 MEQ PO TBCR
40.0000 meq | EXTENDED_RELEASE_TABLET | Freq: Once | ORAL | Status: AC
  Administered 2012-11-26: 40 meq via ORAL
  Filled 2012-11-26: qty 2

## 2012-11-26 MED ORDER — PROCHLORPERAZINE EDISYLATE 5 MG/ML IJ SOLN
10.0000 mg | Freq: Once | INTRAMUSCULAR | Status: AC
  Administered 2012-11-26: 10 mg via INTRAVENOUS
  Filled 2012-11-26: qty 2

## 2012-11-26 NOTE — ED Notes (Signed)
Pt c/o generalized HA x 5 days; pt sts hx of migraine but this is more severe; pt sts some dizziness and blurry vision

## 2012-11-26 NOTE — ED Notes (Signed)
Potassium, 2.0 critical low. Walter from lab.

## 2012-11-26 NOTE — ED Provider Notes (Signed)
History     CSN: [id number]  Arrival date & time 11/26/12  1553   First MD Initiated Contact with Patient 11/26/12 1928      Chief Complaint  Patient presents with  . Headache    HPI Natalie Camacho is a 47 y.o. female who presented to the ED for concern of headache.  Reports that 4 days ago had sudden onset headache.  Moderate in severity.  Different than previous migraines.  Bilateral pressure feeling radiating to back of neck.  No neck stiffness.  No fevers.  No chills.  No weakness/numbness.  No seizures.  No speech difficulties.  No other symptoms.  Past Medical History  Diagnosis Date  . Heart murmur   . Asthma   . Sleep apnea   . Hypothyroidism   . Anemia   . Hypertension   . GERD (gastroesophageal reflux disease)   . Anxiety   . Depression   . PONV (postoperative nausea and vomiting)     Past Surgical History  Procedure Laterality Date  . Fracture surgery      ORIF rt ankle, tendon sheath removed  . Hernia repair  2001  . Rectocele repair    . Nose surgery    . Carpal tunnel release    . Fundal      fundal plication  . Abdominoplasty      History reviewed. No pertinent family history.  History  Substance Use Topics  . Smoking status: Never Smoker   . Smokeless tobacco: Not on file  . Alcohol Use: No    OB History   Grav Para Term Preterm Abortions TAB SAB Ect Mult Living                  Review of Systems  Constitutional: Negative for fever and chills.  HENT: Negative for congestion, rhinorrhea, neck pain and neck stiffness.   Respiratory: Negative for cough and shortness of breath.   Cardiovascular: Negative for chest pain.  Gastrointestinal: Negative for nausea, vomiting, abdominal pain, diarrhea and abdominal distention.  Endocrine: Negative for polyuria.  Genitourinary: Negative for dysuria.  Skin: Negative for rash.  Neurological: Negative for headaches.  Psychiatric/Behavioral: Negative.   All other systems reviewed and are  negative.    Allergies  Accolate; Aspirin; Augmentin; Codeine; Erythromycin; Lyrica; and Peanut-containing drug products  Home Medications   Current Outpatient Rx  Name  Route  Sig  Dispense  Refill  . ARIPiprazole (ABILIFY) 5 MG tablet   Oral   Take 5 mg by mouth daily.         . B Complex Vitamins (B COMPLEX 1) tablet   Oral   Take 1 tablet by mouth daily.           . cyclobenzaprine (FLEXERIL) 10 MG tablet   Oral   Take 10 mg by mouth 2 (two) times daily.         . DULoxetine (CYMBALTA) 60 MG capsule   Oral   Take 60 mg by mouth 2 (two) times daily.          . furosemide (LASIX) 20 MG tablet   Oral   Take 20 mg by mouth daily.           Marland Kitchen ibuprofen (ADVIL,MOTRIN) 200 MG tablet   Oral   Take 400 mg by mouth daily as needed. For pain         . levalbuterol (XOPENEX HFA) 45 MCG/ACT inhaler   Inhalation   Inhale 1-2 puffs  into the lungs every 4 (four) hours as needed for wheezing. For shortness of  Breath         . levothyroxine (SYNTHROID, LEVOTHROID) 200 MCG tablet   Oral   Take 200 mcg by mouth daily.         . pantoprazole (PROTONIX) 40 MG tablet   Oral   Take 40 mg by mouth every morning.           . potassium chloride SA (K-DUR,KLOR-CON) 20 MEQ tablet   Oral   Take 20 mEq by mouth 2 (two) times daily.         Marland Kitchen rOPINIRole (REQUIP) 3 MG tablet   Oral   Take 3 mg by mouth at bedtime.           . simvastatin (ZOCOR) 20 MG tablet   Oral   Take 20 mg by mouth at bedtime.           . triamterene-hydrochlorothiazide (MAXZIDE-25) 37.5-25 MG per tablet   Oral   Take 1 tablet by mouth every morning.           . zolpidem (AMBIEN) 10 MG tablet   Oral   Take 10 mg by mouth at bedtime.             BP 133/83  Pulse 87  Temp(Src) 98.3 F (36.8 C) (Oral)  Resp 17  SpO2 99%  Physical Exam  Nursing note and vitals reviewed. Constitutional: She is oriented to person, place, and time. She appears well-developed and  well-nourished. No distress.  HENT:  Head: Normocephalic and atraumatic.  Right Ear: External ear normal.  Left Ear: External ear normal.  Nose: Nose normal.  Mouth/Throat: Oropharynx is clear and moist. No oropharyngeal exudate.  Eyes: EOM are normal. Pupils are equal, round, and reactive to light.  Neck: Normal range of motion. Neck supple. No tracheal deviation present.  Cardiovascular: Normal rate.   Pulmonary/Chest: Effort normal and breath sounds normal. No stridor. No respiratory distress. She has no wheezes. She has no rales.  Abdominal: Soft. She exhibits no distension. There is no tenderness. There is no rebound.  Musculoskeletal: Normal range of motion.  Neurological: She is alert and oriented to person, place, and time. She has normal strength and normal reflexes. No cranial nerve deficit or sensory deficit. She displays a negative Romberg sign. Gait (ambulates with cane 2/2 previous orthopedic issues.) abnormal. Coordination normal. GCS eye subscore is 4. GCS verbal subscore is 5. GCS motor subscore is 6.  Skin: Skin is warm and dry. She is not diaphoretic.    ED Course  Procedures (including critical care time)  Labs Reviewed  CBC WITH DIFFERENTIAL - Abnormal; Notable for the following:    WBC 16.2 (*)    Neutro Abs 9.5 (*)    Lymphs Abs 5.4 (*)    Monocytes Absolute 1.1 (*)    All other components within normal limits  BASIC METABOLIC PANEL - Abnormal; Notable for the following:    Potassium 2.0 (*)    Chloride 94 (*)    Glucose, Bld 100 (*)    GFR calc non Af Amer 75 (*)    GFR calc Af Amer 87 (*)    All other components within normal limits  MAGNESIUM   No results found.   Date: 11/27/2012  Rate: 74  Rhythm: normal sinus rhythm  QRS Axis: normal  Intervals: normal  ST/T Wave abnormalities: normal  Conduction Disutrbances:none  Narrative Interpretation: NSR.  Normal EKG  Old EKG Reviewed: unchanged    1. Hypokalemia   2. Headache       MDM   Natalie Camacho is a 47 y.o. female who presents to the ED for concern of sudden onset headache that started 4 days ago.  CTA done to r/o SAH.  Negative.  No evidence of meningitis, stroke, or acute glaucoma.  Labs checked and showing potassium of 2.0.  Likely 2/2 patient not taking PO potassium in setting of diuretic use.  IV and PO potassium given.  No EKG changes.  Medicine consulted for admission.  Patient's headache much improved s/p migraine cocktail.  Patient admitted.        Arloa Koh, MD 11/27/12 4540  Arloa Koh, MD 11/27/12 9811

## 2012-11-26 NOTE — ED Notes (Signed)
EDP at bedside  

## 2012-11-26 NOTE — ED Notes (Signed)
Pt transported to CT ?

## 2012-11-26 NOTE — ED Notes (Signed)
EKG given to Dr. Lynelle Doctor. Copy placed in pt chart.

## 2012-11-27 ENCOUNTER — Encounter (HOSPITAL_COMMUNITY): Payer: Self-pay | Admitting: Anesthesiology

## 2012-11-27 DIAGNOSIS — E876 Hypokalemia: Secondary | ICD-10-CM

## 2012-11-27 DIAGNOSIS — R195 Other fecal abnormalities: Secondary | ICD-10-CM | POA: Diagnosis present

## 2012-11-27 DIAGNOSIS — R51 Headache: Secondary | ICD-10-CM | POA: Diagnosis present

## 2012-11-27 DIAGNOSIS — R519 Headache, unspecified: Secondary | ICD-10-CM | POA: Diagnosis present

## 2012-11-27 LAB — MAGNESIUM: Magnesium: 2.2 mg/dL (ref 1.5–2.5)

## 2012-11-27 LAB — BASIC METABOLIC PANEL
CO2: 23 mEq/L (ref 19–32)
Calcium: 8.7 mg/dL (ref 8.4–10.5)
Creatinine, Ser: 0.87 mg/dL (ref 0.50–1.10)
GFR calc non Af Amer: 79 mL/min — ABNORMAL LOW (ref >90)
Sodium: 136 mEq/L (ref 135–145)

## 2012-11-27 MED ORDER — SIMVASTATIN 20 MG PO TABS
20.0000 mg | ORAL_TABLET | Freq: Every day | ORAL | Status: DC
  Administered 2012-11-27 (×2): 20 mg via ORAL
  Filled 2012-11-27 (×3): qty 1

## 2012-11-27 MED ORDER — POTASSIUM CHLORIDE 10 MEQ/100ML IV SOLN
10.0000 meq | INTRAVENOUS | Status: DC
  Administered 2012-11-27: 10 meq via INTRAVENOUS
  Filled 2012-11-27 (×3): qty 100

## 2012-11-27 MED ORDER — KETOROLAC TROMETHAMINE 30 MG/ML IJ SOLN
30.0000 mg | Freq: Four times a day (QID) | INTRAMUSCULAR | Status: DC | PRN
  Administered 2012-11-27: 30 mg via INTRAVENOUS
  Filled 2012-11-27: qty 1

## 2012-11-27 MED ORDER — ARIPIPRAZOLE 5 MG PO TABS
5.0000 mg | ORAL_TABLET | Freq: Every day | ORAL | Status: DC
  Administered 2012-11-27 – 2012-11-28 (×2): 5 mg via ORAL
  Filled 2012-11-27 (×2): qty 1

## 2012-11-27 MED ORDER — ENOXAPARIN SODIUM 40 MG/0.4ML ~~LOC~~ SOLN
40.0000 mg | Freq: Every day | SUBCUTANEOUS | Status: DC
  Administered 2012-11-27 – 2012-11-28 (×2): 40 mg via SUBCUTANEOUS
  Filled 2012-11-27 (×2): qty 0.4

## 2012-11-27 MED ORDER — LEVOTHYROXINE SODIUM 200 MCG PO TABS
200.0000 ug | ORAL_TABLET | Freq: Every day | ORAL | Status: DC
  Administered 2012-11-27 – 2012-11-28 (×2): 200 ug via ORAL
  Filled 2012-11-27 (×3): qty 1

## 2012-11-27 MED ORDER — TRIAMTERENE-HCTZ 37.5-25 MG PO TABS
1.0000 | ORAL_TABLET | Freq: Every day | ORAL | Status: DC
  Filled 2012-11-27: qty 1

## 2012-11-27 MED ORDER — ROPINIROLE HCL 1 MG PO TABS
3.0000 mg | ORAL_TABLET | Freq: Every day | ORAL | Status: DC
  Administered 2012-11-27 (×2): 3 mg via ORAL
  Filled 2012-11-27 (×3): qty 3

## 2012-11-27 MED ORDER — POTASSIUM CHLORIDE CRYS ER 20 MEQ PO TBCR
40.0000 meq | EXTENDED_RELEASE_TABLET | Freq: Two times a day (BID) | ORAL | Status: DC
  Administered 2012-11-27: 40 meq via ORAL

## 2012-11-27 MED ORDER — ONDANSETRON HCL 4 MG/2ML IJ SOLN: 4.0000 mg | Freq: Four times a day (QID) | INTRAMUSCULAR | Status: DC | PRN

## 2012-11-27 MED ORDER — FUROSEMIDE 20 MG PO TABS
20.0000 mg | ORAL_TABLET | Freq: Every day | ORAL | Status: DC
  Filled 2012-11-27: qty 1

## 2012-11-27 MED ORDER — SODIUM CHLORIDE 0.9 % IJ SOLN
3.0000 mL | Freq: Two times a day (BID) | INTRAMUSCULAR | Status: DC
  Administered 2012-11-27 – 2012-11-28 (×3): 3 mL via INTRAVENOUS

## 2012-11-27 MED ORDER — PNEUMOCOCCAL VAC POLYVALENT 25 MCG/0.5ML IJ INJ
0.5000 mL | INJECTION | Freq: Once | INTRAMUSCULAR | Status: AC
  Administered 2012-11-27: 0.5 mL via INTRAMUSCULAR
  Filled 2012-11-27: qty 0.5

## 2012-11-27 MED ORDER — MAGNESIUM SULFATE 50 % IJ SOLN: 1.0000 g | Freq: Once | INTRAMUSCULAR | Status: DC

## 2012-11-27 MED ORDER — POTASSIUM CHLORIDE CRYS ER 20 MEQ PO TBCR
40.0000 meq | EXTENDED_RELEASE_TABLET | ORAL | Status: AC
  Administered 2012-11-27 (×3): 40 meq via ORAL
  Filled 2012-11-27 (×3): qty 2

## 2012-11-27 MED ORDER — PANTOPRAZOLE SODIUM 40 MG PO TBEC
40.0000 mg | DELAYED_RELEASE_TABLET | Freq: Every day | ORAL | Status: DC
  Administered 2012-11-27 – 2012-11-28 (×2): 40 mg via ORAL
  Filled 2012-11-27 (×2): qty 1

## 2012-11-27 MED ORDER — DULOXETINE HCL 60 MG PO CPEP
60.0000 mg | ORAL_CAPSULE | Freq: Two times a day (BID) | ORAL | Status: DC
  Administered 2012-11-27 – 2012-11-28 (×3): 60 mg via ORAL
  Filled 2012-11-27 (×5): qty 1

## 2012-11-27 MED ORDER — POTASSIUM CHLORIDE IN NACL 40-0.9 MEQ/L-% IV SOLN
INTRAVENOUS | Status: DC
  Administered 2012-11-27: 03:00:00 via INTRAVENOUS
  Filled 2012-11-27 (×3): qty 1000

## 2012-11-27 MED ORDER — POTASSIUM CHLORIDE CRYS ER 20 MEQ PO TBCR
20.0000 meq | EXTENDED_RELEASE_TABLET | Freq: Two times a day (BID) | ORAL | Status: DC
  Administered 2012-11-27: 20 meq via ORAL
  Filled 2012-11-27 (×3): qty 1

## 2012-11-27 MED ORDER — ONDANSETRON HCL 4 MG PO TABS: 4.0000 mg | ORAL_TABLET | Freq: Four times a day (QID) | ORAL | Status: DC | PRN

## 2012-11-27 MED ORDER — MAGNESIUM SULFATE IN D5W 10-5 MG/ML-% IV SOLN
1.0000 g | Freq: Once | INTRAVENOUS | Status: AC
  Administered 2012-11-27: 1 g via INTRAVENOUS
  Filled 2012-11-27: qty 100

## 2012-11-27 MED ORDER — MAGNESIUM SULFATE 40 MG/ML IJ SOLN
2.0000 g | Freq: Once | INTRAMUSCULAR | Status: AC
  Administered 2012-11-27: 2 g via INTRAVENOUS
  Filled 2012-11-27: qty 50

## 2012-11-27 MED ORDER — ZOLPIDEM TARTRATE 5 MG PO TABS
5.0000 mg | ORAL_TABLET | Freq: Every day | ORAL | Status: DC
  Administered 2012-11-27: 5 mg via ORAL
  Filled 2012-11-27: qty 1

## 2012-11-27 NOTE — Progress Notes (Signed)
Utilization review completed.  P.J. Meili Kleckley,RN,BSN Case Manager [phone]  

## 2012-11-27 NOTE — H&P (Signed)
History and Physical  Natalie Camacho [facility] ZOX:[id number] DOB: 1965-12-06 DOA: 11/26/2012  Referring physician: ER   PCP: Default, Provider, MD   Chief Complaint: Headache  HPI:  47 year old female with past medical history most significant for asthma, anemia, hypothyroidism, hypertension, reflux disease, anxiety, depression who came in with chief complaints of sudden onset headache. Patient said that the headache started about 4 days ago started as a sharp pain at the back of her head and was associated with dizziness. The headache has progressively gotten worse since last 4 days. Today when she came to the hospital it was 9/10. The headache starts from the back of her head and then move forward. Patient wakes up with headaches in the middle of the night. Exacerbated by standing, stress and relieved with ibuprofen and Tylenol. Headache is also associated with blurring of vision. Pain is currently described as 1/10. CT angiogram was ordered in the ER suggestive of no evidence of stroke or vascular abnormality. Blood work in the ER suggested severe hypokalemia with potassium of 2.0 and internal medicine team was called to admit the patient for observation. Patient's headache has resolved now.     Review of Systems:  Patient has been having loose bowel movements since last 4-5 days. 1-2 bowel movements every day. Patient denies any chest pain, shortness of breath, abdominal pain, weakness or numbness anywhere in the body, palpitations, wheezing, changes in bowel movements, changes in urinary habits, rashes.   Past Medical History  Diagnosis Date  . Heart murmur   . Asthma   . Sleep apnea   . Hypothyroidism   . Anemia   . Hypertension   . GERD (gastroesophageal reflux disease)   . Anxiety   . Depression   . PONV (postoperative nausea and vomiting)     Past Surgical History  Procedure Laterality Date  . Fracture surgery      ORIF rt ankle, tendon sheath removed  . Hernia repair  2001  .  Rectocele repair    . Nose surgery    . Carpal tunnel release    . Fundal      fundal plication  . Abdominoplasty      Social History:  reports that she has never smoked. She does not have any smokeless tobacco history on file. She reports that she does not drink alcohol or use illicit drugs.  Allergies  Allergen Reactions  . Accolate (Zafirlukast) Hives  . Aspirin     wheezing  . Augmentin (Amoxicillin-Pot Clavulanate) Hives and Nausea And Vomiting  . Codeine Nausea And Vomiting  . Erythromycin Nausea And Vomiting  . Lyrica (Pregabalin)     unknown  . Peanut-Containing Drug Products     Wheezing     History reviewed. No pertinent family history.   Prior to Admission medications   Medication Sig Start Date End Date Taking? Authorizing Provider  ARIPiprazole (ABILIFY) 5 MG tablet Take 5 mg by mouth daily.   Yes Historical Provider, MD  B Complex Vitamins (B COMPLEX 1) tablet Take 1 tablet by mouth daily.     Yes Historical Provider, MD  cyclobenzaprine (FLEXERIL) 10 MG tablet Take 10 mg by mouth 2 (two) times daily.   Yes Historical Provider, MD  DULoxetine (CYMBALTA) 60 MG capsule Take 60 mg by mouth 2 (two) times daily.    Yes Historical Provider, MD  furosemide (LASIX) 20 MG tablet Take 20 mg by mouth daily.     Yes Historical Provider, MD  ibuprofen (ADVIL,MOTRIN) 200 MG tablet  Take 400 mg by mouth daily as needed. For pain   Yes Historical Provider, MD  levalbuterol (XOPENEX HFA) 45 MCG/ACT inhaler Inhale 1-2 puffs into the lungs every 4 (four) hours as needed for wheezing. For shortness of  Breath   Yes Historical Provider, MD  levothyroxine (SYNTHROID, LEVOTHROID) 200 MCG tablet Take 200 mcg by mouth daily.   Yes Historical Provider, MD  pantoprazole (PROTONIX) 40 MG tablet Take 40 mg by mouth every morning.     Yes Historical Provider, MD  potassium chloride SA (K-DUR,KLOR-CON) 20 MEQ tablet Take 20 mEq by mouth 2 (two) times daily.   Yes Historical Provider, MD   rOPINIRole (REQUIP) 3 MG tablet Take 3 mg by mouth at bedtime.     Yes Historical Provider, MD  simvastatin (ZOCOR) 20 MG tablet Take 20 mg by mouth at bedtime.     Yes Historical Provider, MD  triamterene-hydrochlorothiazide (MAXZIDE-25) 37.5-25 MG per tablet Take 1 tablet by mouth every morning.     Yes Historical Provider, MD  zolpidem (AMBIEN) 10 MG tablet Take 10 mg by mouth at bedtime.     Yes Historical Provider, MD   Physical Exam: Filed Vitals:   11/26/12 2130 11/26/12 2145 11/26/12 2200 11/26/12 2330  BP: 107/65 112/73 114/70 112/56  Pulse: 75 65 80 77  Temp:      TempSrc:      Resp: 16 15 21 12   SpO2: 94% 98% 98% 97%   Physical Exam: General: Vital signs reviewed and noted. Well-developed, well-nourished, in no acute distress; alert, appropriate and cooperative throughout examination.  Head: Normocephalic, atraumatic.  Eyes: PERRL, EOMI, No signs of anemia or jaundince.  Nose: Mucous membranes moist, not inflammed, nonerythematous.  Throat: Oropharynx nonerythematous, no exudate appreciated.   Neck: No deformities, masses, or tenderness noted.Supple, No carotid Bruits, no JVD.  Lungs:  Normal respiratory effort. Clear to auscultation BL without crackles or wheezes.  Heart: RRR. S1 and S2 normal without gallop, murmur, or rubs.  Abdomen:  BS normoactive. Soft, Nondistended, non-tender.  No masses or organomegaly.  Extremities: No pretibial edema.  Neurologic: A&O X3, CN II - XII are grossly intact. Motor strength is 5/5 in the all 4 extremities, Sensations intact to light touch, Cerebellar signs negative.  Skin: No visible rashes, scars.     Wt Readings from Last 3 Encounters:  06/12/11 200 lb (90.719 kg)    Labs on Admission:  Basic Metabolic Panel:  Recent Labs Lab 11/26/12 1945 11/26/12 2216  NA 137  --   K 2.0*  --   CL 94*  --   CO2 30  --   GLUCOSE 100*  --   BUN 16  --   CREATININE 0.91  --   CALCIUM 9.3  --   MG  --  1.6    CBC:  Recent  Labs Lab 11/26/12 1945  WBC 16.2*  NEUTROABS 9.5*  HGB 13.4  HCT 38.2  MCV 81.1  PLT 358      Radiological Exams on Admission: CT angiogram of the head Negative study   EKG: Independently reviewed. 74 beats per minute, normal axis, normal intervals, no ST and T wave abnormalities   Principal Problem:   Hypokalemia Active Problems:   Headache   Loose bowel movements  anxiety and depression Reflux disease Hypothyroidism Asthma Anemia Hypertension  Assessment/Plan Patient is a 48 year old female with past medical history as noted above who was initially seen in ER for severe headache since last 4 days. CT angiogram  was done of the head to rule out subarachnoid hemorrhage and found to be negative. Patient was found to be hypokalemic during the lab work and hence it was decided to admit the patient as an observation overnight until her potassium is repleted. -Admit to telemetry as observation -Replete potassium 40 mEq in 1 L normal saline at 125 cc an hour -Replete magnesium 2 g -Most likely cause of hypokalemia seems to be the urinary loss from use of Lasix and GI loss loose bowel movements in last 4-5 days -Obtain basic metabolic profile in a.m. Continue home medications. Reevaluate in the morning for discharge.  Continue ketorolac 30 mg IV every 6 hours when necessary for headaches.   Code Status: Full code Family Communication: Patient updated at bedside as family not present Disposition Plan/Anticipated LOS: 1-2 days  Time spent: 70 minutes  Lars Mage, MD  [facility] Team 5  If 7PM-7AM, please contact night-coverage at [email or web address], password [facility] 11/27/2012, 1:07 AM

## 2012-11-27 NOTE — ED Provider Notes (Signed)
I  reviewed the resident's note and I agree with the findings and plan.     Nelia Shi, MD 11/27/12 [phone]

## 2012-11-27 NOTE — Progress Notes (Signed)
Patient ID: Natalie Camacho [facility]  female  QIH:[id number]    DOB: 1966-08-03    DOA: 11/26/2012  PCP: Default, Provider, MD  Assessment/Plan: Principal Problem:   Hypokalemia - Potassium still low at 2.8 this morning, patient refused to IV replacement due to burning sensation - Placed on oral potassium replacement, will recheck potassium later today - Hold Lasix, discontinue IV fluids, triamterene/ HCTZ  Active Problems:   Headache: Improving, no fevers, no neck stiffness  History of peripheral edema: Currently stable, DC IV fluids  DVT Prophylaxis:  Code Status:  Disposition: Hopefully tomorrow  Patient admitted this morning, H&P reviewed  Subjective: States headache is improving, felt tired, no fevers, chills or any neck stiffness  Objective: Weight change:   Intake/Output Summary (Last 24 hours) at 11/27/12 1220 Last data filed at 11/27/12 0900  Gross per 24 hour  Intake    600 ml  Output      0 ml  Net    600 ml   Blood pressure 100/61, pulse 86, temperature 97.6 F (36.4 C), temperature source Oral, resp. rate 18, height 5' 7.5" (1.715 m), weight 96.979 kg (213 lb 12.8 oz), SpO2 95.00%.  Physical Exam: General: Alert and awake, oriented x3, not in any acute distress. HEENT: anicteric sclera, pupils reactive to light and accommodation, EOMI, no neck stiffness CVS: S1-S2 clear, no murmur rubs or gallops Chest: clear to auscultation bilaterally, no wheezing, rales or rhonchi Abdomen: soft nontender, nondistended, normal bowel sounds, no organomegaly Extremities: no cyanosis, clubbing or edema noted bilaterally Neuro: Cranial nerves II-XII intact, no focal neurological deficits  Lab Results: Basic Metabolic Panel:  Recent Labs Lab 11/26/12 1945  11/27/12 0625  NA 137  --  136  K 2.0*  --  2.8*  CL 94*  --  101  CO2 30  --  23  GLUCOSE 100*  --  207*  BUN 16  --  18  CREATININE 0.91  --  0.87  CALCIUM 9.3  --  8.7  MG  --   < > 2.2  < > = values in this  interval not displayed. CBC:  Recent Labs Lab 11/26/12 1945  WBC 16.2*  NEUTROABS 9.5*  HGB 13.4  HCT 38.2  MCV 81.1  PLT 358   Cardiac Enzymes: No results found for this basename: CKTOTAL, CKMB, CKMBINDEX, TROPONINI,  in the last 168 hours BNP: No components found with this basename: POCBNP,  CBG: No results found for this basename: GLUCAP,  in the last 168 hours   Micro Results: No results found for this or any previous visit (from the past 240 hour(s)).  Studies/Results: Ct Angio Head W/cm &/or Wo Cm  11/27/2012  *RADIOLOGY REPORT*  Clinical Data:  Sudden onset of headache 5 days ago.  Dizziness and blurred vision.  CT ANGIOGRAPHY HEAD  Technique:  Multidetector CT imaging of the head was performed using the standard protocol during bolus administration of intravenous contrast.  Multiplanar CT image reconstructions including MIPs were obtained to evaluate the vascular anatomy.  Contrast: 50mL OMNIPAQUE IOHEXOL 350 MG/ML SOLN  Comparison:   None.  Findings:  The brain itself has a normal appearance without evidence of malformation, atrophy, old or acute infarction, mass lesion, hemorrhage, hydrocephalus or extra-axial collection.  Both internal carotid arteries are widely patent into the brain. The anterior and middle cerebral vessels are normal without proximal stenosis, aneurysm or vascular malformation.  Both vertebral arteries are patent with the left being dominant. There is slight irregularity of the  midportion of the basilar with narrowing no more than about 20%.  This could be due to an early manifestation of atherosclerotic disease or fibromuscular disease. Posterior circulation branch vessels are normal.  No venous pathology is seen.   Review of the MIP images confirms the above findings.  IMPRESSION: Normal examination with respect to the anterior circulation.  Mild irregularity of the basilar artery without flow-limiting stenosis. Differential diagnosis is early  atherosclerotic change versus mild fibromuscular irregularity.  No vascular lesion seen to explain the patient's described symptoms.  Normal appearance of the brain as well.   Original Report Authenticated By: Paulina Fusi, M.D.     Medications: Scheduled Meds: . ARIPiprazole  5 mg Oral Daily  . DULoxetine  60 mg Oral BID  . enoxaparin (LOVENOX) injection  40 mg Subcutaneous Daily  . levothyroxine  200 mcg Oral QAC breakfast  . magnesium sulfate 1 - 4 g bolus IVPB  1 g Intravenous Once  . pantoprazole  40 mg Oral Daily  . potassium chloride SA  40 mEq Oral Q4H  . rOPINIRole  3 mg Oral QHS  . simvastatin  20 mg Oral QHS  . sodium chloride  3 mL Intravenous Q12H  . zolpidem  5 mg Oral QHS      LOS: 1 day   RAI,RIPUDEEP M.D. [facility] 11/27/2012, 12:20 PM Pager: [phone]  If 7PM-7AM, please contact night-coverage [email or web address] Password [email or web address]

## 2012-11-28 DIAGNOSIS — R197 Diarrhea, unspecified: Secondary | ICD-10-CM

## 2012-11-28 DIAGNOSIS — R51 Headache: Secondary | ICD-10-CM

## 2012-11-28 LAB — BASIC METABOLIC PANEL
BUN: 16 mg/dL (ref 6–23)
CO2: 24 mEq/L (ref 19–32)
Calcium: 8.3 mg/dL — ABNORMAL LOW (ref 8.4–10.5)
Chloride: 109 mEq/L (ref 96–112)
Creatinine, Ser: 0.64 mg/dL (ref 0.50–1.10)
GFR calc Af Amer: >90 mL/min (ref >90)

## 2012-11-28 MED ORDER — POTASSIUM CHLORIDE CRYS ER 20 MEQ PO TBCR: 20.0000 meq | EXTENDED_RELEASE_TABLET | Freq: Two times a day (BID) | ORAL | Status: DC

## 2012-11-28 NOTE — Discharge Summary (Signed)
Physician Discharge Summary  Patient ID: Natalie Camacho MRN: [id number] DOB/AGE: 45-Oct-1967 45 y.o.  Admit date: 11/26/2012 Discharge date: 11/28/2012  Primary Care Physician:  Emeterio Reeve, MD  Discharge Diagnoses:    . Headache- likely migraine . Hypokalemia . Loose bowel movements  Hypertension   Consults:  None   Discharge Instructions: 1) Please stop Triamterene-HCTZ  2) Hold Lasix and potassium until you see your primary doctor on Monday, get BMET/renal panel on Monday.    Discharge Medications:   Medication List    STOP taking these medications       furosemide 20 MG tablet  Commonly known as:  LASIX     potassium chloride SA 20 MEQ tablet  Commonly known as:  K-DUR,KLOR-CON     triamterene-hydrochlorothiazide 37.5-25 MG per tablet  Commonly known as:  MAXZIDE-25      TAKE these medications       ARIPiprazole 5 MG tablet  Commonly known as:  ABILIFY  Take 5 mg by mouth daily.     B COMPLEX 1 tablet  Take 1 tablet by mouth daily.     cyclobenzaprine 10 MG tablet  Commonly known as:  FLEXERIL  Take 10 mg by mouth 2 (two) times daily.     DULoxetine 60 MG capsule  Commonly known as:  CYMBALTA  Take 60 mg by mouth 2 (two) times daily.     ibuprofen 200 MG tablet  Commonly known as:  ADVIL,MOTRIN  Take 400 mg by mouth daily as needed. For pain     levalbuterol 45 MCG/ACT inhaler  Commonly known as:  XOPENEX HFA  Inhale 1-2 puffs into the lungs every 4 (four) hours as needed for wheezing. For shortness of  Breath     levothyroxine 200 MCG tablet  Commonly known as:  SYNTHROID, LEVOTHROID  Take 200 mcg by mouth daily.     pantoprazole 40 MG tablet  Commonly known as:  PROTONIX  Take 40 mg by mouth every morning.     rOPINIRole 3 MG tablet  Commonly known as:  REQUIP  Take 3 mg by mouth at bedtime.     simvastatin 20 MG tablet  Commonly known as:  ZOCOR  Take 20 mg by mouth at bedtime.     zolpidem 10 MG tablet  Commonly known as:   AMBIEN  Take 10 mg by mouth at bedtime.         Brief H and P: For complete details please refer to admission H and P, but in brief 47 year old female with past medical history most significant for asthma, anemia, hypothyroidism, hypertension, reflux disease, anxiety, depression who came in with chief complaints of sudden onset headache. Patient said that the headache started about 4 days ago started as a sharp pain at the back of her head and was associated with dizziness. The headache had progressively gotten worse since last 4 days, started from the back of her head and then moved forward. Patient wakes up with headaches in the middle of the night. Exacerbated by standing, stress and relieved with ibuprofen and Tylenol. Headache is also associated with blurring of vision. Pain is currently described as 1/10. CT angiogram was ordered in the ER suggestive of no evidence of stroke or vascular abnormality. Blood work in the ER suggested severe hypokalemia with potassium of 2.0   Hospital Course:   Hypokalemia: likely being on Lasix, patient had run out of potassium tablets and ongoing diarrhea. She was placed on IV and oral potassium replacement.  K at time of DC is 3.7, Mag was also replaced. Lasix and triamterene/ HCTZ were held as patient's BP was borderline during hospitalization.   Headache: Improving, no fevers, no neck stiffness, likely migraine.    History of peripheral edema: Currently stable, holding lasix.    Day of Discharge BP 105/68  Pulse 80  Temp(Src) 97.6 F (36.4 C) (Oral)  Resp 18  Ht 5' 7.5" (1.715 m)  Wt 96.979 kg (213 lb 12.8 oz)  BMI 32.97 kg/m2  SpO2 97%  Physical Exam: General: Alert and awake oriented x3 not in any acute distress. HEENT: anicteric sclera, pupils reactive to light and accommodation CVS: S1-S2 clear no murmur rubs or gallops Chest: clear to auscultation bilaterally, no wheezing rales or rhonchi Abdomen: soft nontender, nondistended, normal  bowel sounds, no organomegaly Extremities: no cyanosis, clubbing or edema noted bilaterally Neuro: Cranial nerves II-XII intact, no focal neurological deficits   The results of significant diagnostics from this hospitalization (including imaging, microbiology, ancillary and laboratory) are listed below for reference.    LAB RESULTS: Basic Metabolic Panel:  Recent Labs Lab 11/27/12 0625 11/27/12 1824 11/28/12 0800  NA 136  --  141  K 2.8* 3.1* 3.7  CL 101  --  109  CO2 23  --  24  GLUCOSE 207*  --  154*  BUN 18  --  16  CREATININE 0.87  --  0.64  CALCIUM 8.7  --  8.3*  MG 2.2  --   --    CBC:  Recent Labs Lab 11/26/12 1945  WBC 16.2*  NEUTROABS 9.5*  HGB 13.4  HCT 38.2  MCV 81.1  PLT 358   Cardiac Enzymes: No results found for this basename: CKTOTAL, CKMB, CKMBINDEX, TROPONINI,  in the last 168 hours BNP: No components found with this basename: POCBNP,  CBG: No results found for this basename: GLUCAP,  in the last 168 hours  Significant Diagnostic Studies:  Ct Angio Head W/cm &/or Wo Cm  11/27/2012  *RADIOLOGY REPORT*  Clinical Data:  Sudden onset of headache 5 days ago.  Dizziness and blurred vision.  CT ANGIOGRAPHY HEAD  Technique:  Multidetector CT imaging of the head was performed using the standard protocol during bolus administration of intravenous contrast.  Multiplanar CT image reconstructions including MIPs were obtained to evaluate the vascular anatomy.  Contrast: 50mL OMNIPAQUE IOHEXOL 350 MG/ML SOLN  Comparison:   None.  Findings:  The brain itself has a normal appearance without evidence of malformation, atrophy, old or acute infarction, mass lesion, hemorrhage, hydrocephalus or extra-axial collection.  Both internal carotid arteries are widely patent into the brain. The anterior and middle cerebral vessels are normal without proximal stenosis, aneurysm or vascular malformation.  Both vertebral arteries are patent with the left being dominant. There is slight  irregularity of the midportion of the basilar with narrowing no more than about 20%.  This could be due to an early manifestation of atherosclerotic disease or fibromuscular disease. Posterior circulation branch vessels are normal.  No venous pathology is seen.   Review of the MIP images confirms the above findings.  IMPRESSION: Normal examination with respect to the anterior circulation.  Mild irregularity of the basilar artery without flow-limiting stenosis. Differential diagnosis is early atherosclerotic change versus mild fibromuscular irregularity.  No vascular lesion seen to explain the patient's described symptoms.  Normal appearance of the brain as well.   Original Report Authenticated By: Paulina Fusi, M.D.     2D ECHO:   Disposition and  Follow-up:     Discharge Orders   Future Orders Complete By Expires     Diet - low sodium heart healthy  As directed     Discharge instructions  As directed     Comments:      1) Please stop Triamterene-HCTZ 2) Hold Lasix and potassium until you see your primary doctor on Monday, get BMET/renal panel on Monday.    Increase activity slowly  As directed         DISPOSITION: home\  DIET: heart healthy diet  ACTIVITY: as tolerated    DISCHARGE FOLLOW-UP Follow-up Information   Follow up with Emeterio Reeve, MD. Schedule an appointment as soon as possible for a visit on 12/02/2012. (for labs and hospital follow-up, get BMET for potassium level on Monday)    Contact information:   [address] [place] [address] [phone]       Time spent on Discharge: 35 mins  Signed:   Irina Okelly M.D. [facility] 11/28/2012, 12:21 PM Pager: [phone]

## 2013-05-29 ENCOUNTER — Telehealth: Payer: Self-pay | Admitting: Internal Medicine

## 2013-05-29 NOTE — Telephone Encounter (Signed)
S/W PT IN RE TO NP APPT 09/15 @ 10:30 W/DR. CHISM REFERRING DR. SHARON WOLTERS DX- PERSISTENTLY ELEVATED WBC CT WELCOME PACKET MAILED.

## 2013-05-29 NOTE — Telephone Encounter (Signed)
C/D 05/29/13 for appt. 06/16/13 °

## 2013-06-16 ENCOUNTER — Ambulatory Visit (HOSPITAL_BASED_OUTPATIENT_CLINIC_OR_DEPARTMENT_OTHER): Payer: 59 | Admitting: Internal Medicine

## 2013-06-16 ENCOUNTER — Encounter: Payer: Self-pay | Admitting: Internal Medicine

## 2013-06-16 ENCOUNTER — Telehealth: Payer: Self-pay | Admitting: *Deleted

## 2013-06-16 ENCOUNTER — Ambulatory Visit (HOSPITAL_BASED_OUTPATIENT_CLINIC_OR_DEPARTMENT_OTHER): Payer: 59

## 2013-06-16 ENCOUNTER — Ambulatory Visit (HOSPITAL_BASED_OUTPATIENT_CLINIC_OR_DEPARTMENT_OTHER): Payer: 59 | Admitting: Lab

## 2013-06-16 VITALS — BP 153/81 | HR 79 | Temp 98.3°F | Resp 19 | Ht 67.5 in | Wt 218.9 lb

## 2013-06-16 DIAGNOSIS — D72829 Elevated white blood cell count, unspecified: Secondary | ICD-10-CM

## 2013-06-16 LAB — CBC WITH DIFFERENTIAL/PLATELET
Basophils Absolute: 0.1 10*3/uL (ref 0.0–0.1)
EOS%: 1.5 % (ref 0.0–7.0)
HGB: 13.8 g/dL (ref 11.6–15.9)
LYMPH%: 35.9 % (ref 14.0–49.7)
MCH: 28.3 pg (ref 25.1–34.0)
MCV: 83.7 fL (ref 79.5–101.0)
MONO%: 6.2 % (ref 0.0–14.0)
NEUT%: 55.6 % (ref 38.4–76.8)
Platelets: 331 10*3/uL (ref 145–400)
RDW: 14.1 % (ref 11.2–14.5)

## 2013-06-16 NOTE — Patient Instructions (Addendum)
Leukocytosis Leukocytosis means you have more white blood cells than normal. White blood cells are made in your bone marrow. The main job of white blood cells is to fight infection. Having too many white blood cells is a common condition. It can develop as a result of many types of medical problems. CAUSES  In some cases, your bone marrow may be normal, but it is still making too many white blood cells. This could be the result of:  Infection.  Injury.  Physical stress.  Emotional stress.  Surgery.  Allergic reactions.  Tumors that do not start in the blood or bone marrow.  An inherited disease.  Certain medicines.  Pregnancy and labor. In other cases, you may have a bone marrow disorder that is causing your body to make too many white blood cells. Bone marrow disorders include:  Leukemia. This is a type of blood cancer.  Myeloproliferative disorders. These disorders cause blood cells to grow abnormally. SYMPTOMS  Some people have no symptoms. Others have symptoms due to the medical problem that is causing their leukocytosis. These symptoms may include:  Bleeding.  Bruising.  Fever.  Night sweats.  Repeated infections.  Weakness.  Weight loss. DIAGNOSIS  Leukocytosis is often found during blood tests that are done as part of a normal physical exam. Your caregiver will probably order other tests to help determine why you have too many white blood cells. These tests may include:  A complete blood count (CBC). This test measures all the types of blood cells in your body.  Chest X-rays, urine tests (urinalysis), or other tests to look for signs of infection.  Bone marrow aspiration. For this test, a needle is put into your bone. Cells from the bone marrow are removed through the needle. The cells are then examined under a microscope. TREATMENT  Treatment is usually not needed for leukocytosis. However, if a disorder is causing your leukocytosis, it will need to be  treated. Treatment may include:  Antibiotic medicines if you have a bacterial infection.  Bone marrow transplant. Your diseased bone marrow is replaced with healthy cells that will grow new bone marrow.  Chemotherapy. This is the use of drugs to kill cancer cells. HOME CARE INSTRUCTIONS  Only take over-the-counter or prescription medicines as directed by your caregiver.  Maintain a healthy weight. Ask your caregiver what weight is best for you.  Eat foods that are low in saturated fats and high in fiber. Eat plenty of fruits and vegetables.  Drink enough fluids to keep your urine clear or pale yellow.  Get 30 minutes of exercise at least 5 times a week. Check with your caregiver before starting a new exercise routine.  Limit caffeine and alcohol.  Do not smoke.  Keep all follow-up appointments as directed by your caregiver. SEEK MEDICAL CARE IF:  You feel weak or more tired than usual.  You develop chills, a cough, or nasal congestion.  You lose weight without trying.  You have night sweats.  You bruise easily. SEEK IMMEDIATE MEDICAL CARE IF:  You bleed more than normal.  You have chest pain.  You have trouble breathing.  You have a fever.  You have uncontrolled nausea or vomiting.  You feel dizzy or lightheaded. MAKE SURE YOU:  Understand these instructions.  Will watch your condition.  Will get help right away if you are not doing well or get worse. Document Released: 09/07/2011 Document Revised: 12/11/2011 Document Reviewed: 09/07/2011 [employer] Patient Information 2014 [employer].  

## 2013-06-16 NOTE — Telephone Encounter (Signed)
appts made and printed...td 

## 2013-06-16 NOTE — Progress Notes (Signed)
No financial issues and checked in new patient. Mail and phone only for communication.

## 2013-06-18 ENCOUNTER — Telehealth: Payer: Self-pay

## 2013-06-18 ENCOUNTER — Encounter: Payer: Self-pay | Admitting: Internal Medicine

## 2013-06-18 NOTE — Telephone Encounter (Signed)
S/w pt: her WBC are normal per Dr Rosie Fate. Pt stated thank you

## 2013-06-18 NOTE — Progress Notes (Signed)
Patient History and Physical   Natalie Reeve, MD [address] [place], [place] [address]  Natalie Camacho [facility] [id number] July 10, 1966 47 y.o.   Chief Complaint: Leukocytosis NOS  HPI:  Patient is a pleasant 47 year old female who presents at the request of her primary care doctor, Dr. Mila Palmer for evaluation of her leukocytosis.  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  She reports using CPAP previously but no longer requires it because she lost weight (248 lbs to 225 lbs).  She had a history of menorrhagia but now she is s/p endometrial ablation (2012).  She denies recurrent infections.  Her CBC obtained in August 25, revealed mild leukocytosis of 11.9 with slightly elevated lymph of 3.1 and Neut of 7.7.  She denies new medications or currently being on steroids. Her comprehensive metabolic panel including her liver and renal function tests were within normal limits.  She does have a low vitamin d level (16.3) which was being replaced with vitamin d supplementation. 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).  Her prior cbc revealed a WBC of 12.5 in December 03, 2012.  She complains of feeling fatigued.  She has had one sinus infection this year that required antibiotics.  She denies recurrent UTIs or pneumonias.  She denies a family history of leukemia, bleeding problems.  She is uptodate on her cancer preventive screening.   PMH: Past Medical History  Diagnosis Date  . Heart murmur   . Asthma   . Sleep apnea   . Hypothyroidism   . Anemia   . Hypertension   . GERD (gastroesophageal reflux disease)   . Anxiety   . Depression   . PONV (postoperative nausea and vomiting)   . WUXLKGMW(102.7)      Past Surgical History  Procedure Laterality Date  . Fracture surgery      ORIF rt ankle, tendon sheath removed  . Hernia repair  2001  . Rectocele repair    . Nose surgery    . Carpal tunnel release    . Fundal      fundal plication  . Abdominoplasty      Allergies: Allergies  Allergen Reactions  . Accolate [Zafirlukast] Hives  . Aspirin     wheezing  . Augmentin [Amoxicillin-Pot Clavulanate] Hives and Nausea And Vomiting  . Codeine Nausea And Vomiting  . Erythromycin Nausea And Vomiting  . Lyrica [Pregabalin]     unknown  . Peanut-Containing Drug Products     Wheezing     Medications: Current outpatient prescriptions:ARIPiprazole (ABILIFY) 5 MG tablet, Take 5 mg by mouth daily., Disp: , Rfl: ;  B Complex Vitamins (B COMPLEX 1) tablet, Take 1 tablet by mouth daily.  , Disp: , Rfl: ;  buPROPion (WELLBUTRIN XL) 300 MG 24 hr tablet, Take 300 mg by mouth daily., Disp: , Rfl: ;  docusate sodium (COLACE) 100 MG capsule, Take 100 mg by mouth at bedtime as needed for constipation., Disp: , Rfl:  DULoxetine (CYMBALTA) 60 MG capsule, Take 60 mg by mouth 2 (two) times daily. , Disp: , Rfl: ;  ergocalciferol (VITAMIN D2) 50000 UNITS capsule, Take 50,000 Units by mouth once a week. For 8 weeks ends 07/15/13, Disp: , Rfl: ;  furosemide (LASIX) 20  MG tablet, Take 20 mg by mouth., Disp: , Rfl: ;  ibuprofen (ADVIL,MOTRIN) 200 MG tablet, Take 400 mg by mouth daily as needed. For pain, Disp: , Rfl:  levalbuterol (XOPENEX HFA) 45 MCG/ACT inhaler, Inhale 1-2 puffs into the lungs every 4 (four) hours as needed for wheezing. For shortness of  Breath, Disp: , Rfl: ;  levothyroxine (SYNTHROID, LEVOTHROID) 200 MCG tablet, Take 200 mcg by mouth daily., Disp: , Rfl: ;  Melatonin 5 MG TABS, Take 3 tablets by mouth at bedtime as needed., Disp: , Rfl: ;  pantoprazole (PROTONIX) 40 MG tablet, Take 40 mg by mouth every morning.  , Disp: , Rfl:  potassium chloride SA (K-DUR,KLOR-CON) 20 MEQ tablet, Take 20 mEq  by mouth daily., Disp: , Rfl: ;  rOPINIRole (REQUIP) 3 MG tablet, Take 3 mg by mouth at bedtime.  , Disp: , Rfl: ;  simvastatin (ZOCOR) 20 MG tablet, Take 20 mg by mouth at bedtime.  , Disp: , Rfl: ;  triamcinolone (NASACORT) 55 MCG/ACT nasal inhaler, Place 2 sprays into the nose daily as needed., Disp: , Rfl:  zolpidem (AMBIEN) 10 MG tablet, Take 10 mg by mouth at bedtime.  , Disp: , Rfl:    Social History:   reports that she has never smoked. She does not have any smokeless tobacco history on file. She reports that she does not drink alcohol or use illicit drugs.  Family History: Family History  Problem Relation Age of Onset  . Hypothyroidism Mother   . Diabetes Father     Review of Systems: Constitutional ROS: She denies Fever , Chills, Night Sweats, Anorexia, Pain 2 out of 10 Cardiovascular ROS: no chest pain or dyspnea on exertion Respiratory ROS: no cough, shortness of breath, or wheezing Neurological ROS: no TIA or stroke symptoms Dermatological ROS: negative for rash ENT ROS: negative for - epistaxis Gastrointestinal ROS: no abdominal pain, change in bowel habits, or black or bloody stools Genito-Urinary ROS: no dysuria, trouble voiding, or hematuria Hematological and Lymphatic ROS: negative for - blood clots Breast ROS: negative for breast lumps Musculoskeletal ROS: negative for - gait disturbance Remaining ROS negative.  Physical Exam: Blood pressure 153/81, pulse 79, temperature 98.3 F (36.8 C), temperature source Oral, resp. rate 19, height 5' 7.5" (1.715 m), weight 218 lb 14.4 oz (99.292 kg), SpO2 99.00%. ECOG: 0 General appearance: alert, cooperative, appears stated age and no distress Head: Normocephalic, without obvious abnormality, atraumatic Neck: no adenopathy, supple, symmetrical, trachea midline and thyroid not enlarged, symmetric, no tenderness/mass/nodules Lymph nodes: Cervical, supraclavicular, and axillary nodes normal. HEENT:  PERRLA: EOMi; OP without  masses or lesions Heart:regular rate and rhythm, S1, S2 normal, no murmur, click, rub or gallop Lung:chest clear, no wheezing, rales, normal symmetric air entry, Heart exam - S1, S2 normal, no murmur, no gallop, rate regular Abdomin: soft, non-tender, without masses or organomegaly EXT:no erythema, induration, or nodules Neuro: Non focal Skin:No rashes  Lab Results:  CBC    Component Value Date/Time   WBC 9.9 06/16/2013 1242   WBC 16.2* 11/26/2012 1945   RBC 4.89 06/16/2013 1242   RBC 4.71 11/26/2012 1945   HGB 13.8 06/16/2013 1242   HGB 13.4 11/26/2012 1945   HCT 40.9 06/16/2013 1242   HCT 38.2 11/26/2012 1945   PLT 331 06/16/2013 1242   PLT 358 11/26/2012 1945   MCV 83.7 06/16/2013 1242   MCV 81.1 11/26/2012 1945   MCH 28.3 06/16/2013 1242   MCH 28.5 11/26/2012 1945   MCHC 33.8  06/16/2013 1242   MCHC 35.1 11/26/2012 1945   RDW 14.1 06/16/2013 1242   RDW 13.7 11/26/2012 1945   LYMPHSABS 3.6* 06/16/2013 1242   LYMPHSABS 5.4* 11/26/2012 1945   MONOABS 0.6 06/16/2013 1242   MONOABS 1.1* 11/26/2012 1945   EOSABS 0.1 06/16/2013 1242   EOSABS 0.2 11/26/2012 1945   BASOSABS 0.1 06/16/2013 1242   BASOSABS 0.1 11/26/2012 1945      Chemistry      Component Value Date/Time   NA 141 11/28/2012 0800   K 3.7 11/28/2012 0800   CL 109 11/28/2012 0800   CO2 24 11/28/2012 0800   BUN 16 11/28/2012 0800   CREATININE 0.64 11/28/2012 0800      Component Value Date/Time   CALCIUM 8.3* 11/28/2012 0800       Radiological Studies: None  Impression and Plan:   1. Leukocytosis NOS.  Some of the common causes of an isolated WBC include: Infection, Injury, Physical stress, Emotional stress, Surgery, medications like steroids and leukemias or myeloproliderative.  Today her white count is WNL.  Her elevation could certainly be related her ongoing nerve injury.   We reviewed her smear and did not appreciate any abnormal cells characteristic of leukemia.   -- We will repeat test to ensure stability.  Her other counts  including her hemogloblin and platelets remain WNL.      Jakub Debold, MD 06/16/2013  6:20 pm

## 2013-06-18 NOTE — Telephone Encounter (Signed)
Message copied by Gaylord Shih on Wed Jun 18, 2013  2:36 PM ------      Message from: [facility], DAVID      Created: Wed Jun 18, 2013  4:51 AM      Regarding: Report wbc to patient please       I saw her this Monday.  Please call to report her normal WBC.  Thanks ------

## 2013-11-04 ENCOUNTER — Other Ambulatory Visit: Payer: Self-pay

## 2013-11-04 DIAGNOSIS — Z1231 Encounter for screening mammogram for malignant neoplasm of breast: Secondary | ICD-10-CM

## 2013-11-19 ENCOUNTER — Ambulatory Visit
Admission: RE | Admit: 2013-11-19 | Discharge: 2013-11-19 | Disposition: A | Discharge status: Home / Self Care | Payer: 59 | Source: Ambulatory Visit

## 2013-11-19 ENCOUNTER — Ambulatory Visit: Payer: 59

## 2013-11-19 DIAGNOSIS — Z1231 Encounter for screening mammogram for malignant neoplasm of breast: Secondary | ICD-10-CM

## 2013-12-15 ENCOUNTER — Ambulatory Visit (HOSPITAL_BASED_OUTPATIENT_CLINIC_OR_DEPARTMENT_OTHER): Payer: 59 | Admitting: Internal Medicine

## 2013-12-15 ENCOUNTER — Telehealth: Payer: Self-pay | Admitting: Internal Medicine

## 2013-12-15 ENCOUNTER — Other Ambulatory Visit (HOSPITAL_BASED_OUTPATIENT_CLINIC_OR_DEPARTMENT_OTHER): Payer: 59

## 2013-12-15 VITALS — BP 149/89 | HR 78 | Temp 98.2°F | Resp 17 | Ht 67.5 in | Wt 215.7 lb

## 2013-12-15 DIAGNOSIS — D72829 Elevated white blood cell count, unspecified: Secondary | ICD-10-CM

## 2013-12-15 LAB — CBC WITH DIFFERENTIAL/PLATELET
BASO%: 0.7 % (ref 0.0–2.0)
BASOS ABS: 0.1 10*3/uL (ref 0.0–0.1)
EOS ABS: 0.3 10*3/uL (ref 0.0–0.5)
EOS%: 2.7 % (ref 0.0–7.0)
HCT: 39.8 % (ref 34.8–46.6)
HEMOGLOBIN: 13.3 g/dL (ref 11.6–15.9)
LYMPH#: 3.4 10*3/uL — AB (ref 0.9–3.3)
LYMPH%: 31.1 % (ref 14.0–49.7)
MCH: 28.7 pg (ref 25.1–34.0)
MCHC: 33.4 g/dL (ref 31.5–36.0)
MCV: 85.8 fL (ref 79.5–101.0)
MONO#: 0.7 10*3/uL (ref 0.1–0.9)
MONO%: 6.5 % (ref 0.0–14.0)
NEUT%: 59 % (ref 38.4–76.8)
NEUTROS ABS: 6.4 10*3/uL (ref 1.5–6.5)
Platelets: 277 10*3/uL (ref 145–400)
RBC: 4.64 10*6/uL (ref 3.70–5.45)
RDW: 14.4 % (ref 11.2–14.5)
WBC: 10.9 10*3/uL — ABNORMAL HIGH (ref 3.9–10.3)

## 2013-12-15 NOTE — Telephone Encounter (Signed)
, °

## 2013-12-16 NOTE — Progress Notes (Signed)
[place] OFFICE PROGRESS NOTE  Lilian Coma, MD [address] [place]  [address]  DIAGNOSIS: Leukocytosis, unspecified - Plan: CBC with Differential, CBC with Differential, Comprehensive metabolic panel (Cmet) - [facility], Lactate dehydrogenase (LDH) - [facility]  Chief Complaint  Patient presents with  . Leukocytosis, unspecified    CURRENT TREATMENT: Observation.  INTERVAL HISTORY: Natalie Camacho 48 y.o. female who presented at the request of her primary care doctor, Dr. Jonathon Jordan for evaluation of her leukocytosis on 06/16/2013.   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 She reports using CPAP previously but no longer requires it because she lost weight (248 lbs to 225 lbs). She had a history of menorrhagia but now she is s/p endometrial ablation (2012). She denies recurrent infections.   Her CBC obtained in August 25, revealed mild leukocytosis of 11.9 with slightly elevated lymph of 3.1 and Neut of 7.7. She denied new medications or  being on steroids. Her comprehensive metabolic panel including her liver and renal function tests were within normal limits. She does have a low vitamin d level (16.3) which was being replaced with vitamin d supplementation.   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 Mechele Dawley of the [facility]). Her prior cbc revealed a WBC of 12.5 in December 03, 2012. She complains of feeling fatigued. She has had one sinus infection this year that required antibiotics. She denies recurrent UTIs or pneumonias. She denies a family history of leukemia, bleeding problems. She is uptodate on her cancer preventive screening.   Today, she reports with her mother.  She denies any recent  hospitalizations or emergency room visits.    MEDICAL HISTORY: Past Medical History  Diagnosis Date  . Heart murmur   . Asthma   . Sleep apnea   . Hypothyroidism   . Anemia   . Hypertension   . GERD (gastroesophageal reflux disease)   . Anxiety   . Depression   . PONV (postoperative nausea and vomiting)   . Headache(784.0)     INTERIM HISTORY: has Headache; Hypokalemia; Loose bowel movements; and Leukocytosis, unspecified on her problem list.    ALLERGIES:  is allergic to accolate; aspirin; augmentin; codeine; erythromycin; lyrica; and peanut-containing drug products.  MEDICATIONS: has a current medication list which includes the following prescription(s): aripiprazole, b complex 1, bupropion, docusate sodium, duloxetine, ergocalciferol, furosemide, ibuprofen, levalbuterol, levothyroxine, melatonin, pantoprazole, potassium chloride sa, ropinirole, simvastatin, topiramate, triamcinolone, valacyclovir, and zolpidem.  SURGICAL HISTORY:  Past Surgical History  Procedure Laterality Date  . Fracture surgery      ORIF rt ankle, tendon sheath removed  . Hernia repair  2001  . Rectocele repair    . Nose surgery    . Carpal tunnel release    . Fundal      fundal plication  . Abdominoplasty      REVIEW OF SYSTEMS:   Constitutional: Denies fevers, chills or abnormal weight loss Eyes: Denies blurriness of vision Ears, nose, mouth, throat, and face: Denies mucositis or sore throat Respiratory: Denies cough, dyspnea or wheezes Cardiovascular: Denies palpitation, chest discomfort or lower extremity swelling Gastrointestinal:  Denies nausea, heartburn or change in bowel habits Skin: Denies abnormal skin rashes Lymphatics: Denies new lymphadenopathy or easy bruising Neurological:Denies numbness, tingling or new weaknesses Behavioral/Psych: Mood  is stable, no new changes  All other systems were reviewed with the patient and are negative.  PHYSICAL EXAMINATION: ECOG PERFORMANCE  STATUS: 1 - Symptomatic but completely ambulatory  Blood pressure 149/89, pulse 78, temperature 98.2 F (36.8 C), temperature source Oral, resp. rate 17, height 5' 7.5" (1.715 m), weight 215 lb 11.2 oz (97.841 kg), SpO2 97.00%.  GENERAL:alert, no distress and comfortable SKIN: skin color, texture, turgor are normal, no rashes or significant lesions EYES: normal, Conjunctiva are pink and non-injected, sclera clear OROPHARYNX:no exudate, no erythema and lips, buccal mucosa, and tongue normal  NECK: supple, thyroid normal size, non-tender, without nodularity LYMPH:  no palpable lymphadenopathy in the cervical, axillary or supraclavicular LUNGS: clear to auscultation with normal breathing effort, no wheezes or rhonchi HEART: regular rate & rhythm and no murmurs and no lower extremity edema ABDOMEN:abdomen soft, non-tender and normal bowel sounds Musculoskeletal:no cyanosis of digits and no clubbing  NEURO: alert & oriented x 3 with fluent speech, no focal motor/sensory deficits  Labs:  Lab Results  Component Value Date   WBC 10.9* 12/15/2013   HGB 13.3 12/15/2013   HCT 39.8 12/15/2013   MCV 85.8 12/15/2013   PLT 277 12/15/2013   NEUTROABS 6.4 12/15/2013      Chemistry      Component Value Date/Time   NA 141 11/28/2012 0800   K 3.7 11/28/2012 0800   CL 109 11/28/2012 0800   CO2 24 11/28/2012 0800   BUN 16 11/28/2012 0800   CREATININE 0.64 11/28/2012 0800      Component Value Date/Time   CALCIUM 8.3* 11/28/2012 0800       Basic Metabolic Panel: No results found for this basename: NA, K, CL, CO2, GLUCOSE, BUN, CREATININE, CALCIUM, MG, PHOS,  in the last 168 hours GFR Estimated Creatinine Clearance: 106.5 ml/min (by C-G formula based on Cr of 0.64). Liver Function Tests: No results found for this basename: AST, ALT, ALKPHOS, BILITOT, PROT, ALBUMIN,  in the last 168 hours No results found for this basename: LIPASE, AMYLASE,  in the last 168 hours No results found for this basename:  AMMONIA,  in the last 168 hours Coagulation profile No results found for this basename: INR, PROTIME,  in the last 168 hours  CBC:  Recent Labs Lab 12/15/13 0848  WBC 10.9*  NEUTROABS 6.4  HGB 13.3  HCT 39.8  MCV 85.8  PLT 277    Anemia work up No results found for this basename: VITAMINB12, FOLATE, FERRITIN, TIBC, IRON, RETICCTPCT,  in the last 72 hours  Studies:  No results found.   RADIOGRAPHIC STUDIES: Mm Digital Screening Bilateral  11/19/2013   CLINICAL DATA:  Screening.  EXAM: DIGITAL SCREENING BILATERAL MAMMOGRAM WITH CAD  COMPARISON:  Previous exam(s).  ACR Breast Density Category b: There are scattered areas of fibroglandular density.  FINDINGS: There are no findings suspicious for malignancy. Images were processed with CAD.  IMPRESSION: No mammographic evidence of malignancy. A result letter of this screening mammogram will be mailed directly to the patient.  RECOMMENDATION: Screening mammogram in one year. (Code:SM-B-01Y)  BI-RADS CATEGORY  1: Negative.   Electronically Signed   By: Christiana Pellant M.D.   On: 11/19/2013 15:41    ASSESSMENT: Natalie Camacho 48 y.o. female with a history of Leukocytosis, unspecified - Plan: CBC with Differential, CBC with Differential, Comprehensive metabolic panel (Cmet) - [facility], Lactate dehydrogenase (LDH) - [facility]   PLAN:  1. Leukocytosis NOS.   --She continues to do well clinically.  We  again discussed some of the common causes of an isolated WBC include: Infection, Injury, Physical stress, Emotional stress, Surgery, medications like steroids and leukemias or myeloproliderative.   Today her white count is 10.9 and last visit it was within normal limits. Her elevation could certainly be related her ongoing nerve injury. Last visit, we reviewed her smear and did not appreciate any abnormal cells characteristic of leukemia.   -- We will repeat test to ensure stability. Her other counts including her hemogloblin and platelets remain  WNL.   All questions were answered. The patient knows to call the clinic with any problems, questions or concerns. We can certainly see the patient much sooner if necessary.  I spent 10 minutes counseling the patient face to face. The total time spent in the appointment was 15 minutes.    Endya Austin, MD 12/16/2013 5:30 AM

## 2014-03-23 ENCOUNTER — Other Ambulatory Visit (HOSPITAL_BASED_OUTPATIENT_CLINIC_OR_DEPARTMENT_OTHER): Payer: 59

## 2014-03-23 DIAGNOSIS — D72829 Elevated white blood cell count, unspecified: Secondary | ICD-10-CM

## 2014-03-23 LAB — CBC WITH DIFFERENTIAL/PLATELET
BASO%: 0.5 % (ref 0.0–2.0)
BASOS ABS: 0.1 10*3/uL (ref 0.0–0.1)
EOS ABS: 0.3 10*3/uL (ref 0.0–0.5)
EOS%: 2.3 % (ref 0.0–7.0)
HEMATOCRIT: 39.5 % (ref 34.8–46.6)
HEMOGLOBIN: 13 g/dL (ref 11.6–15.9)
LYMPH%: 21.8 % (ref 14.0–49.7)
MCH: 28.9 pg (ref 25.1–34.0)
MCHC: 33 g/dL (ref 31.5–36.0)
MCV: 87.7 fL (ref 79.5–101.0)
MONO#: 0.8 10*3/uL (ref 0.1–0.9)
MONO%: 5.4 % (ref 0.0–14.0)
NEUT%: 70 % (ref 38.4–76.8)
NEUTROS ABS: 10.2 10*3/uL — AB (ref 1.5–6.5)
PLATELETS: 311 10*3/uL (ref 145–400)
RBC: 4.51 10*6/uL (ref 3.70–5.45)
RDW: 13.4 % (ref 11.2–14.5)
WBC: 14.6 10*3/uL — ABNORMAL HIGH (ref 3.9–10.3)
lymph#: 3.2 10*3/uL (ref 0.9–3.3)

## 2014-06-09 ENCOUNTER — Telehealth: Payer: Self-pay | Admitting: Hematology

## 2014-06-09 NOTE — Telephone Encounter (Signed)
pt called and r/s 9/21 appt to 9/29. pt has new d/t.

## 2014-06-22 ENCOUNTER — Ambulatory Visit: Payer: 59

## 2014-06-22 ENCOUNTER — Other Ambulatory Visit: Payer: 59

## 2014-06-30 ENCOUNTER — Encounter: Payer: Self-pay | Admitting: Hematology

## 2014-06-30 ENCOUNTER — Other Ambulatory Visit (HOSPITAL_BASED_OUTPATIENT_CLINIC_OR_DEPARTMENT_OTHER): Payer: 59

## 2014-06-30 ENCOUNTER — Ambulatory Visit (HOSPITAL_BASED_OUTPATIENT_CLINIC_OR_DEPARTMENT_OTHER): Payer: 59 | Admitting: Hematology

## 2014-06-30 VITALS — BP 140/81 | HR 75 | Temp 98.2°F | Resp 20 | Ht 67.5 in | Wt 208.6 lb

## 2014-06-30 DIAGNOSIS — D72829 Elevated white blood cell count, unspecified: Secondary | ICD-10-CM | POA: Diagnosis not present

## 2014-06-30 DIAGNOSIS — Z23 Encounter for immunization: Secondary | ICD-10-CM

## 2014-06-30 LAB — CBC WITH DIFFERENTIAL/PLATELET
BASO%: 0.8 % (ref 0.0–2.0)
Basophils Absolute: 0.1 10*3/uL (ref 0.0–0.1)
EOS ABS: 0.2 10*3/uL (ref 0.0–0.5)
EOS%: 1.6 % (ref 0.0–7.0)
HCT: 44.1 % (ref 34.8–46.6)
HGB: 14.6 g/dL (ref 11.6–15.9)
LYMPH%: 27.1 % (ref 14.0–49.7)
MCH: 28.5 pg (ref 25.1–34.0)
MCHC: 33 g/dL (ref 31.5–36.0)
MCV: 86.5 fL (ref 79.5–101.0)
MONO#: 0.7 10*3/uL (ref 0.1–0.9)
MONO%: 6.3 % (ref 0.0–14.0)
NEUT%: 64.2 % (ref 38.4–76.8)
NEUTROS ABS: 7.1 10*3/uL — AB (ref 1.5–6.5)
PLATELETS: 335 10*3/uL (ref 145–400)
RBC: 5.1 10*6/uL (ref 3.70–5.45)
RDW: 13.4 % (ref 11.2–14.5)
WBC: 11.1 10*3/uL — ABNORMAL HIGH (ref 3.9–10.3)
lymph#: 3 10*3/uL (ref 0.9–3.3)

## 2014-06-30 LAB — LACTATE DEHYDROGENASE (CC13): LDH: 133 U/L (ref 125–245)

## 2014-06-30 LAB — COMPREHENSIVE METABOLIC PANEL (CC13)
ALK PHOS: 97 U/L (ref 40–150)
ALT: 14 U/L (ref 0–55)
AST: 12 U/L (ref 5–34)
Albumin: 3.3 g/dL — ABNORMAL LOW (ref 3.5–5.0)
Anion Gap: 8 mEq/L (ref 3–11)
BILIRUBIN TOTAL: 0.27 mg/dL (ref 0.20–1.20)
BUN: 8 mg/dL (ref 7.0–26.0)
CO2: 28 mEq/L (ref 22–29)
CREATININE: 0.9 mg/dL (ref 0.6–1.1)
Calcium: 9.4 mg/dL (ref 8.4–10.4)
Chloride: 103 mEq/L (ref 98–109)
GLUCOSE: 99 mg/dL (ref 70–140)
Potassium: 3.4 mEq/L — ABNORMAL LOW (ref 3.5–5.1)
SODIUM: 140 meq/L (ref 136–145)
TOTAL PROTEIN: 7.5 g/dL (ref 6.4–8.3)

## 2014-06-30 MED ORDER — INFLUENZA VAC SPLIT QUAD 0.5 ML IM SUSY
0.5000 mL | PREFILLED_SYRINGE | Freq: Once | INTRAMUSCULAR | Status: AC
  Administered 2014-06-30: 0.5 mL via INTRAMUSCULAR
  Filled 2014-06-30: qty 0.5

## 2014-06-30 NOTE — Patient Instructions (Signed)
Influenza Vaccine (Flu Vaccine, Inactivated or Recombinant) 2014-2015: What You Need to Know 1. Why get vaccinated? Influenza ("flu") is a contagious disease that spreads around the [place] every winter, usually between October and May. Flu is caused by influenza viruses, and is spread mainly by coughing, sneezing, and close contact. Anyone can get flu, but the risk of getting flu is highest among children. Symptoms come on suddenly and may last several days. They can include:  fever/chills  sore throat  muscle aches  fatigue  cough  headache  runny or stuffy nose Flu can make some people much sicker than others. These people include young children, people 65 and older, pregnant women, and people with certain health conditions-such as heart, lung or kidney disease, nervous system disorders, or a weakened immune system. Flu vaccination is especially important for these people, and anyone in close contact with them. Flu can also lead to pneumonia, and make existing medical conditions worse. It can cause diarrhea and seizures in children. Each year thousands of people in the [place] die from flu, and many more are hospitalized. Flu vaccine is the best protection against flu and its complications. Flu vaccine also helps prevent spreading flu from person to person. 2. Inactivated and recombinant flu vaccines You are getting an injectable flu vaccine, which is either an "inactivated" or "recombinant" vaccine. These vaccines do not contain any live influenza virus. They are given by injection with a needle, and often called the "flu shot."  A different live, attenuated (weakened) influenza vaccine is sprayed into the nostrils. This vaccine is described in a separate Vaccine Information Statement. Flu vaccination is recommended every year. Some children 6 months through 8 years of age might need two doses during one year. Flu viruses are always changing. Each year's flu vaccine is made  to protect against 3 or 4 viruses that are likely to cause disease that year. Flu vaccine cannot prevent all cases of flu, but it is the best defense against the disease.  It takes about 2 weeks for protection to develop after the vaccination, and protection lasts several months to a year. Some illnesses that are not caused by influenza virus are often mistaken for flu. Flu vaccine will not prevent these illnesses. It can only prevent influenza. Some inactivated flu vaccine contains a very small amount of a mercury-based preservative called thimerosal. Studies have shown that thimerosal in vaccines is not harmful, but flu vaccines that do not contain a preservative are available. 3. Some people should not get this vaccine Tell the person who gives you the vaccine:  If you have any severe, life-threatening allergies. If you ever had a life-threatening allergic reaction after a dose of flu vaccine, or have a severe allergy to any part of this vaccine, including (for example) an allergy to gelatin, antibiotics, or eggs, you may be advised not to get vaccinated. Most, but not all, types of flu vaccine contain a small amount of egg protein.  If you ever had Guillain-Barr Syndrome (a severe paralyzing illness, also called GBS). Some people with a history of GBS should not get this vaccine. This should be discussed with your doctor.  If you are not feeling well. It is usually okay to get flu vaccine when you have a mild illness, but you might be advised to wait until you feel better. You should come back when you are better. 4. Risks of a vaccine reaction With a vaccine, like any medicine, there is a chance of side   effects. These are usually mild and go away on their own. Problems that could happen after any vaccine:  Brief fainting spells can happen after any medical procedure, including vaccination. Sitting or lying down for about 15 minutes can help prevent fainting, and injuries caused by a fall. Tell  your doctor if you feel dizzy, or have vision changes or ringing in the ears.  Severe shoulder pain and reduced range of motion in the arm where a shot was given can happen, very rarely, after a vaccination.  Severe allergic reactions from a vaccine are very rare, estimated at less than 1 in a million doses. If one were to occur, it would usually be within a few minutes to a few hours after the vaccination. Mild problems following inactivated flu vaccine:  soreness, redness, or swelling where the shot was given  hoarseness  sore, red or itchy eyes  cough  fever  aches  headache  itching  fatigue If these problems occur, they usually begin soon after the shot and last 1 or 2 days. Moderate problems following inactivated flu vaccine:  Young children who get inactivated flu vaccine and pneumococcal vaccine (PCV13) at the same time may be at increased risk for seizures caused by fever. Ask your doctor for more information. Tell your doctor if a child who is getting flu vaccine has ever had a seizure. Inactivated flu vaccine does not contain live flu virus, so you cannot get the flu from this vaccine. As with any medicine, there is a very remote chance of a vaccine causing a serious injury or death. The safety of vaccines is always being monitored. For more information, visit: www.cdc.gov/vaccinesafety/ 5. What if there is a serious reaction? What should I look for?  Look for anything that concerns you, such as signs of a severe allergic reaction, very high fever, or behavior changes. Signs of a severe allergic reaction can include hives, swelling of the face and throat, difficulty breathing, a fast heartbeat, dizziness, and weakness. These would start a few minutes to a few hours after the vaccination. What should I do?  If you think it is a severe allergic reaction or other emergency that can't wait, call 9-1-1 and get the person to the nearest hospital. Otherwise, call your  doctor.  Afterward, the reaction should be reported to the Vaccine Adverse Event Reporting System (VAERS). Your doctor should file this report, or you can do it yourself through the VAERS web site at www.vaers.hhs.gov, or by calling 1-800-822-7967. VAERS does not give medical advice. 6. The National Vaccine Injury Compensation Program The National Vaccine Injury Compensation Program (VICP) is a federal program that was created to compensate people who may have been injured by certain vaccines. Persons who believe they may have been injured by a vaccine can learn about the program and about filing a claim by calling 1-800-338-2382 or visiting the VICP website at www.hrsa.gov/vaccinecompensation. There is a time limit to file a claim for compensation. 7. How can I learn more?  Ask your health care provider.  Call your local or state health department.  Contact the Centers for Disease Control and Prevention (CDC):  Call 1-800-232-4636 (1-800-CDC-INFO) or  Visit CDC's website at www.cdc.gov/flu CDC Vaccine Information Statement (Interim) Inactivated Influenza Vaccine (05/20/2013) Document Released: 07/13/2006 Document Revised: 02/02/2014 Document Reviewed: 09/05/2013 ExitCare Patient Information 2015 [employer]. This information is not intended to replace advice given to you by your health care provider. Make sure you discuss any questions you have with your health   care provider.  

## 2014-06-30 NOTE — Progress Notes (Signed)
[facility] [place] HEMATOLOGY OFFICE PROGRESS NOTE DATE OF VISIT: 06/30/2014  Natalie Coma, MD [address] [place] [address]  DIAGNOSIS: Leukocytosis, unspecified - Plan: Influenza vac split quadrivalent PF (FLUARIX) injection 0.5 mL, CBC with Differential  Chief Complaint  Patient presents with  . Follow-up    CURRENT TREATMENT: Observation.  INTERVAL HISTORY:  Natalie Camacho 48 y.o. female who presented at the request of her primary care doctor, Dr. Jonathon Camacho for evaluation of her leukocytosis on 06/16/2013. She was last seen by Dr Natalie Camacho on 12/15/2013.  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 She reports using CPAP previously but no longer requires it because she lost weight (248 lbs to 225 lbs). She had a history of menorrhagia but now she is s/p endometrial ablation (2012). She denies recurrent infections.   Her CBC obtained in August 25, revealed mild leukocytosis of 11.9 with slightly elevated lymph of 3.1 and Neut of 7.7. She denied new medications or  being on steroids. Her comprehensive metabolic panel including her liver and renal function tests were within normal limits. She does have a low vitamin d level (16.3) which was being replaced with vitamin d supplementation.   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 Mechele Camacho of the [facility]). Her prior cbc revealed a WBC of 12.5 in December 03, 2012. She complains of feeling fatigued. She has had one sinus infection this year that required antibiotics. She denies recurrent UTIs or pneumonias. She denies a family history of leukemia, bleeding problems. She is uptodate on her cancer preventive screening.   Today, she reports with her  mother.  She denies any recent hospitalizations or emergency room visits.    MEDICAL HISTORY: Past Medical History  Diagnosis Date  . Heart murmur   . Asthma   . Sleep apnea   . Hypothyroidism   . Anemia   . Hypertension   . GERD (gastroesophageal reflux disease)   . Anxiety   . Depression   . PONV (postoperative nausea and vomiting)   . Headache(784.0)     INTERIM HISTORY: has Headache; Hypokalemia; Loose bowel movements; and Leukocytosis, unspecified on her problem list.    ALLERGIES:  is allergic to accolate; aspirin; augmentin; codeine; erythromycin; lyrica; and peanut-containing drug products.  MEDICATIONS: has a current medication list which includes the following prescription(s): aripiprazole, b complex 1, bupropion, docusate sodium, duloxetine, ergocalciferol, furosemide, ibuprofen, levalbuterol, levothyroxine, melatonin, pantoprazole, potassium chloride sa, ropinirole, simvastatin, topiramate, triamcinolone, valacyclovir, and zolpidem, and the following Facility-Administered Medications: influenza vac split quadrivalent pf.  SURGICAL HISTORY:  Past Surgical History  Procedure Laterality Date  . Fracture surgery      ORIF rt ankle, tendon sheath removed  . Hernia repair  2001  . Rectocele repair    . Nose surgery    . Carpal tunnel release    . Fundal      fundal plication  . Abdominoplasty      REVIEW OF SYSTEMS:   Constitutional: Denies fevers, chills or abnormal weight loss Eyes: Denies blurriness of vision Ears, nose, mouth, throat, and face: Denies mucositis or sore throat Respiratory: Denies cough, dyspnea or wheezes Cardiovascular: Denies palpitation, chest discomfort or lower extremity swelling Gastrointestinal:  Denies nausea, heartburn or change in bowel habits Skin:  Denies abnormal skin rashes Lymphatics: Denies new lymphadenopathy or easy bruising Neurological:Denies numbness, tingling or new weaknesses Behavioral/Psych: Mood is stable, no new  changes  All other systems were reviewed with the patient and are negative.  PHYSICAL EXAMINATION: ECOG PERFORMANCE STATUS: 1  Blood pressure 140/81, pulse 75, temperature 98.2 F (36.8 C), temperature source Oral, resp. rate 20, height 5' 7.5" (1.715 m), weight 208 lb 9.6 oz (94.62 kg).  GENERAL:alert, no distress and comfortable SKIN: skin color, texture, turgor are normal, no rashes or significant lesions EYES: normal, Conjunctiva are pink and non-injected, sclera clear OROPHARYNX:no exudate, no erythema and lips, buccal mucosa, and tongue normal  NECK: supple, thyroid normal size, non-tender, without nodularity LYMPH:  no palpable lymphadenopathy in the cervical, axillary or supraclavicular LUNGS: clear to auscultation with normal breathing effort, no wheezes or rhonchi HEART: regular rate & rhythm and no murmurs and no lower extremity edema ABDOMEN:abdomen soft, non-tender and normal bowel sounds Musculoskeletal:no cyanosis of digits and no clubbing  NEURO: alert & oriented x 3 with fluent speech, no focal motor/sensory deficits  Labs:           RADIOGRAPHIC STUDIES: Mm Digital Screening Bilateral  11/19/2013   CLINICAL DATA:  Screening.  EXAM: DIGITAL SCREENING BILATERAL MAMMOGRAM WITH CAD  COMPARISON:  Previous exam(s).  ACR Breast Density Category b: There are scattered areas of fibroglandular density.  FINDINGS: There are no findings suspicious for malignancy. Images were processed with CAD.  IMPRESSION: No mammographic evidence of malignancy. A result letter of this screening mammogram will be mailed directly to the patient.  RECOMMENDATION: Screening mammogram in one year. (Code:SM-B-01Y)  BI-RADS CATEGORY  1: Negative.   Electronically Signed   By: Conchita Paris M.D.   On: 11/19/2013 15:41    ASSESSMENT: Natalie Camacho 48 y.o. female with a history of Mild benign leukocytosis.   PLAN:   1. Leukocytosis NOS.   --She continues to do well clinically.  We again  discussed some of the common causes of an isolated WBC include: Infection, Injury, Physical stress, Emotional stress, Surgery, medications like steroids and leukemias or myeloproliderative.   Today her white count is 11.1 and last time  it was 14.6 in June and 10.9 in March 2015.  Her elevation could certainly be related her ongoing nerve injury resulting in pain and mild inflammatory state.  -- We will repeat test in 1 year CBC to ensure stability. Her other counts including her hemogloblin and platelets remain WNL.   --SHE DID GET A FLU SHOT IN OFFICE TODAY.  All questions were answered. The patient knows to call the clinic with any problems, questions or concerns. We can certainly see the patient much sooner if necessary.  I spent 10 minutes counseling the patient face to face. The total time spent in the appointment was 15 minutes.    Bernadene Bell, MD Medical Hematologist/Oncologist [place] Pager: [phone] Office No: [phone]

## 2014-07-01 ENCOUNTER — Telehealth: Payer: Self-pay | Admitting: Hematology

## 2014-07-01 NOTE — Telephone Encounter (Signed)
s.w. pt and advised on Sept 2016...pt ok and aware

## 2015-06-14 ENCOUNTER — Telehealth: Payer: Self-pay | Admitting: Hematology

## 2015-06-14 NOTE — Telephone Encounter (Signed)
Moved 10/3 lab/fu back to 9/30 with [facility]. Former CP1 patient. Left message for patient confirming appointments and mailed schedule.

## 2015-07-02 ENCOUNTER — Ambulatory Visit: Payer: 59

## 2015-07-02 ENCOUNTER — Encounter: Payer: Self-pay | Admitting: Hematology

## 2015-07-02 ENCOUNTER — Other Ambulatory Visit: Payer: 59

## 2015-07-02 ENCOUNTER — Ambulatory Visit (HOSPITAL_BASED_OUTPATIENT_CLINIC_OR_DEPARTMENT_OTHER): Payer: 59 | Admitting: Hematology

## 2015-07-02 ENCOUNTER — Other Ambulatory Visit (HOSPITAL_BASED_OUTPATIENT_CLINIC_OR_DEPARTMENT_OTHER): Payer: 59

## 2015-07-02 VITALS — BP 117/75 | HR 70 | Temp 98.0°F | Resp 18 | Ht 67.5 in | Wt 177.5 lb

## 2015-07-02 DIAGNOSIS — D72829 Elevated white blood cell count, unspecified: Secondary | ICD-10-CM

## 2015-07-02 DIAGNOSIS — R300 Dysuria: Secondary | ICD-10-CM | POA: Diagnosis not present

## 2015-07-02 LAB — CBC & DIFF AND RETIC
BASO%: 0.3 % (ref 0.0–2.0)
BASOS ABS: 0 10*3/uL (ref 0.0–0.1)
EOS%: 1.5 % (ref 0.0–7.0)
Eosinophils Absolute: 0.2 10*3/uL (ref 0.0–0.5)
HEMATOCRIT: 39.2 % (ref 34.8–46.6)
HGB: 13.6 g/dL (ref 11.6–15.9)
IMMATURE RETIC FRACT: 9.2 % (ref 1.60–10.00)
LYMPH#: 2.7 10*3/uL (ref 0.9–3.3)
LYMPH%: 20.5 % (ref 14.0–49.7)
MCH: 29.8 pg (ref 25.1–34.0)
MCHC: 34.7 g/dL (ref 31.5–36.0)
MCV: 86 fL (ref 79.5–101.0)
MONO#: 1 10*3/uL — AB (ref 0.1–0.9)
MONO%: 7.2 % (ref 0.0–14.0)
NEUT#: 9.3 10*3/uL — ABNORMAL HIGH (ref 1.5–6.5)
NEUT%: 70.5 % (ref 38.4–76.8)
PLATELETS: 299 10*3/uL (ref 145–400)
RBC: 4.56 10*6/uL (ref 3.70–5.45)
RDW: 13.3 % (ref 11.2–14.5)
RETIC %: 2.21 % — AB (ref 0.70–2.10)
RETIC CT ABS: 100.78 10*3/uL — AB (ref 33.70–90.70)
WBC: 13.3 10*3/uL — ABNORMAL HIGH (ref 3.9–10.3)

## 2015-07-02 LAB — SEDIMENTATION RATE: Sed Rate: 10 mm/hr (ref 0–20)

## 2015-07-02 LAB — CHCC SMEAR

## 2015-07-02 NOTE — Progress Notes (Signed)
Marland Kitchen  HEMATOLOGY ONCOLOGY PROGRESS NOTE  Date of service: 07/02/2015  Patient Care Team: Jonathon Jordan, MD as PCP - General (Family Medicine)  Diagnosis: Chronic leukocytosis of undetermined significance  Current management: Observation  INTERVAL HISTORY:  Natalie Camacho is here for follow-up of her chronic leukocytosis. She was last seen in September 2015 by Dr. Lona Kettle for her chronic leukocytosis that was thought to be likely  reactive . She is here for follow-up. Notes that she's been doing overall well. Continues to follow with Dr. Mechele Dawley for her pain management related to complex regional pain syndrome related to her right ankle fracture in the past . Having some dysuria. No fevers/chills/night sweats .no other infection since her last visit .has been working hard and is on the significant weight loss diet and has lost about 40 pounds in total .   REVIEW OF SYSTEMS:    10 Point review of systems of done and is negative except as noted above.  . Past Medical History  Diagnosis Date  . Heart murmur   . Asthma   . Sleep apnea   . Hypothyroidism   . Anemia   . Hypertension   . GERD (gastroesophageal reflux disease)   . Anxiety   . Depression   . PONV (postoperative nausea and vomiting)   . Headache(784.0)     . Past Surgical History  Procedure Laterality Date  . Fracture surgery      ORIF rt ankle, tendon sheath removed  . Hernia repair  2001  . Rectocele repair    . Nose surgery    . Carpal tunnel release    . Fundal      fundal plication  . Abdominoplasty      . Social History  Substance Use Topics  . Smoking status: Never Smoker   . Smokeless tobacco: None  . Alcohol Use: No    ALLERGIES:  is allergic to accolate; aspirin; augmentin; codeine; erythromycin; lyrica; and peanut-containing drug products.  MEDICATIONS:  Current Outpatient Prescriptions  Medication Sig Dispense Refill  . Brexpiprazole (REXULTI PO) Take by mouth.    . B Complex Vitamins (B  COMPLEX 1) tablet Take 1 tablet by mouth daily.      Marland Kitchen buPROPion (WELLBUTRIN XL) 300 MG 24 hr tablet Take 300 mg by mouth daily.    Marland Kitchen docusate sodium (COLACE) 100 MG capsule Take 100 mg by mouth at bedtime as needed for constipation.    . DULoxetine (CYMBALTA) 60 MG capsule Take 60 mg by mouth 2 (two) times daily.     . ERGOCALCIFEROL PO Take 2,500 Units by mouth daily.    . furosemide (LASIX) 20 MG tablet Take 20 mg by mouth.    Marland Kitchen ibuprofen (ADVIL,MOTRIN) 200 MG tablet Take 400 mg by mouth daily as needed. For pain    . levalbuterol (XOPENEX HFA) 45 MCG/ACT inhaler Inhale 1-2 puffs into the lungs every 4 (four) hours as needed for wheezing. For shortness of  Breath    . levothyroxine (SYNTHROID, LEVOTHROID) 200 MCG tablet Take 235 mcg by mouth daily.     . Melatonin 5 MG TABS Take 3 tablets by mouth at bedtime as needed.    . potassium chloride SA (K-DUR,KLOR-CON) 20 MEQ tablet Take 20 mEq by mouth daily.    Marland Kitchen rOPINIRole (REQUIP) 3 MG tablet Take 3 mg by mouth at bedtime.      . simvastatin (ZOCOR) 20 MG tablet Take 20 mg by mouth at bedtime.      Marland Kitchen  topiramate (TOPAMAX) 100 MG tablet Take 1 tablet by mouth 2 (two) times daily.    Marland Kitchen triamcinolone (NASACORT) 55 MCG/ACT nasal inhaler Place 2 sprays into the nose daily as needed.    . valACYclovir (VALTREX) 1000 MG tablet Take 0.5 tablets by mouth 2 (two) times daily as needed. Fever blister on lip    . zolpidem (AMBIEN) 10 MG tablet Take 10 mg by mouth at bedtime.       No current facility-administered medications for this visit.    PHYSICAL EXAMINATION: ECOG PERFORMANCE STATUS: 1 - Symptomatic but completely ambulatory  Filed Vitals:   07/02/15 0945  BP: 117/75  Pulse: 70  Temp: 98 F (36.7 C)  Resp: 18   Filed Weights   07/02/15 0945  Weight: 177 lb 8 oz (80.513 kg)   .Body mass index is 27.37 kg/(m^2).  GENERAL:alert, in no acute distress and comfortable SKIN: skin color, texture, turgor are normal, no rashes or significant  lesions EYES: normal, conjunctiva are pink and non-injected, sclera clear OROPHARYNX:no exudate, no erythema and lips, buccal mucosa, and tongue normal  NECK: supple, no JVD, thyroid normal size, non-tender, without nodularity LYMPH:  no palpable lymphadenopathy in the cervical, axillary or inguinal LUNGS: clear to auscultation with normal respiratory effort HEART: regular rate & rhythm,  no murmurs and no lower extremity edema ABDOMEN: abdomen soft, non-tender, normoactive bowel sounds , no hepatosplenomegaly Musculoskeletal: no cyanosis of digits and no clubbing  PSYCH: alert & oriented x 3 with fluent speech NEURO: no focal motor/sensory deficits  LABORATORY DATA:   I have reviewed the data as listed  . CBC Latest Ref Rng 07/02/2015 06/30/2014 03/23/2014  WBC 3.9 - 10.3 10e3/uL 13.3(H) 11.1(H) 14.6(H)  Hemoglobin 11.6 - 15.9 g/dL 13.6 14.6 13.0  Hematocrit 34.8 - 46.6 % 39.2 44.1 39.5  Platelets 145 - 400 10e3/uL 299 335 311   . CBC    Component Value Date/Time   WBC 13.3* 07/02/2015 1100   WBC 16.2* 11/26/2012 1945   RBC 4.56 07/02/2015 1100   RBC 4.71 11/26/2012 1945   HGB 13.6 07/02/2015 1100   HGB 13.4 11/26/2012 1945   HCT 39.2 07/02/2015 1100   HCT 38.2 11/26/2012 1945   PLT 299 07/02/2015 1100   PLT 358 11/26/2012 1945   MCV 86.0 07/02/2015 1100   MCV 81.1 11/26/2012 1945   MCH 29.8 07/02/2015 1100   MCH 28.5 11/26/2012 1945   MCHC 34.7 07/02/2015 1100   MCHC 35.1 11/26/2012 1945   RDW 13.3 07/02/2015 1100   RDW 13.7 11/26/2012 1945   LYMPHSABS 2.7 07/02/2015 1100   LYMPHSABS 5.4* 11/26/2012 1945   MONOABS 1.0* 07/02/2015 1100   MONOABS 1.1* 11/26/2012 1945   EOSABS 0.2 07/02/2015 1100   EOSABS 0.2 11/26/2012 1945   BASOSABS 0.0 07/02/2015 1100   BASOSABS 0.1 11/26/2012 1945      . CMP Latest Ref Rng 06/30/2014 11/28/2012 11/27/2012  Glucose 70 - 140 mg/dl 99 154(H) -  BUN 7.0 - 26.0 mg/dL 8.0 16 -  Creatinine 0.6 - 1.1 mg/dL 0.9 0.64 -  Sodium 136 -  145 mEq/L 140 141 -  Potassium 3.5 - 5.1 mEq/L 3.4(L) 3.7 3.1(L)  Chloride 96 - 112 mEq/L - 109 -  CO2 22 - 29 mEq/L 28 24 -  Calcium 8.4 - 10.4 mg/dL 9.4 8.3(L) -  Total Protein 6.4 - 8.3 g/dL 7.5 - -  Total Bilirubin 0.20 - 1.20 mg/dL 0.27 - -  Alkaline Phos 40 - 150 U/L 97 - -  AST 5 - 34 U/L 12 - -  ALT 0 - 55 U/L 14 - -   Peripheral Blood Smear 07/02/2015: Normocytic normochromic red cells, no schistocytes. Normal platelet counts. Slightly increased neutrophils with a few bands. No increased blasts.     RADIOGRAPHIC STUDIES: I have personally reviewed the radiological images as listed and agreed with the findings in the report. No results found.  ASSESSMENT & PLAN: 49 year old female with  1) chronic leukocytosis currently with some mild neutrophilia. Likely reactive. Patient is having some symptoms of urinary tract irritation/infection. Her chronic leukocytosis appears to be reactive and is not overtly suggestive of a myeloproliferative neoplasm. Could be related to some low-level chronic inflammation due to obesity. Or due to her significant chronic pain issues. No overt evidence of an autoimmune condition. 2] possible UTI Plan -We'll get a UA and urine culture to rule out urinary tract infection. Will treat if indication of UTI.  -No indication for additional workup at this time. -The patient has worsening leukocytosis without other obvious reactive etiology might consider doing a MPN genetic panel +/- BM examination. There is no indication at this time given low suspicion of MPN. -Continue follow-up with her primary care physician.  -Please reconsult hematology if any new questions or concerns arise.  I appreciate the opportunity to help with the care of this patient.  I spent 15 minutes counseling the patient face to face. The total time spent in the appointment was 20 minutes and more than 50% was on counseling and direct patient cares.    Sullivan Lone MD [place] AAHIVMS  [facility] Hematology/Oncology Physician [facility]  (Office):       [phone] (Work cell):  [phone] (Fax):           [phone]  07/02/2015 4:16 PM

## 2015-07-04 LAB — URINE CULTURE

## 2015-07-05 ENCOUNTER — Other Ambulatory Visit: Payer: Self-pay

## 2015-07-05 ENCOUNTER — Ambulatory Visit: Payer: Self-pay | Admitting: Hematology and Oncology

## 2015-07-15 ENCOUNTER — Emergency Department (HOSPITAL_COMMUNITY)
Admission: EM | Admit: 2015-07-15 | Discharge: 2015-07-15 | Disposition: A | Discharge status: Home / Self Care | Payer: 59 | Attending: Emergency Medicine | Admitting: Emergency Medicine

## 2015-07-15 ENCOUNTER — Encounter (HOSPITAL_COMMUNITY): Payer: Self-pay | Admitting: Emergency Medicine

## 2015-07-15 DIAGNOSIS — I10 Essential (primary) hypertension: Secondary | ICD-10-CM | POA: Insufficient documentation

## 2015-07-15 DIAGNOSIS — Z8669 Personal history of other diseases of the nervous system and sense organs: Secondary | ICD-10-CM | POA: Insufficient documentation

## 2015-07-15 DIAGNOSIS — R945 Abnormal results of liver function studies: Secondary | ICD-10-CM

## 2015-07-15 DIAGNOSIS — N12 Tubulo-interstitial nephritis, not specified as acute or chronic: Secondary | ICD-10-CM

## 2015-07-15 DIAGNOSIS — Z862 Personal history of diseases of the blood and blood-forming organs and certain disorders involving the immune mechanism: Secondary | ICD-10-CM | POA: Diagnosis not present

## 2015-07-15 DIAGNOSIS — E876 Hypokalemia: Secondary | ICD-10-CM | POA: Diagnosis not present

## 2015-07-15 DIAGNOSIS — F419 Anxiety disorder, unspecified: Secondary | ICD-10-CM | POA: Diagnosis not present

## 2015-07-15 DIAGNOSIS — N10 Acute tubulo-interstitial nephritis: Secondary | ICD-10-CM | POA: Insufficient documentation

## 2015-07-15 DIAGNOSIS — F329 Major depressive disorder, single episode, unspecified: Secondary | ICD-10-CM | POA: Diagnosis not present

## 2015-07-15 DIAGNOSIS — R7989 Other specified abnormal findings of blood chemistry: Secondary | ICD-10-CM

## 2015-07-15 DIAGNOSIS — Z8719 Personal history of other diseases of the digestive system: Secondary | ICD-10-CM | POA: Insufficient documentation

## 2015-07-15 DIAGNOSIS — R300 Dysuria: Secondary | ICD-10-CM | POA: Diagnosis present

## 2015-07-15 DIAGNOSIS — Z7952 Long term (current) use of systemic steroids: Secondary | ICD-10-CM | POA: Diagnosis not present

## 2015-07-15 DIAGNOSIS — Z79899 Other long term (current) drug therapy: Secondary | ICD-10-CM | POA: Insufficient documentation

## 2015-07-15 DIAGNOSIS — R011 Cardiac murmur, unspecified: Secondary | ICD-10-CM | POA: Diagnosis not present

## 2015-07-15 DIAGNOSIS — E039 Hypothyroidism, unspecified: Secondary | ICD-10-CM | POA: Diagnosis not present

## 2015-07-15 LAB — URINALYSIS, ROUTINE W REFLEX MICROSCOPIC
Bilirubin Urine: NEGATIVE
GLUCOSE, UA: NEGATIVE mg/dL
KETONES UR: NEGATIVE mg/dL
Nitrite: POSITIVE — AB
PH: 6 (ref 5.0–8.0)
Protein, ur: 30 mg/dL — AB
Specific Gravity, Urine: 1.009 (ref 1.005–1.030)
Urobilinogen, UA: 1 mg/dL (ref 0.0–1.0)

## 2015-07-15 LAB — COMPREHENSIVE METABOLIC PANEL
ALBUMIN: 3.1 g/dL — AB (ref 3.5–5.0)
ALT: 79 U/L — AB (ref 14–54)
AST: 83 U/L — AB (ref 15–41)
Alkaline Phosphatase: 113 U/L (ref 38–126)
Anion gap: 11 (ref 5–15)
BILIRUBIN TOTAL: 0.7 mg/dL (ref 0.3–1.2)
BUN: 14 mg/dL (ref 6–20)
CO2: 24 mmol/L (ref 22–32)
CREATININE: 1.25 mg/dL — AB (ref 0.44–1.00)
Calcium: 9.3 mg/dL (ref 8.9–10.3)
Chloride: 98 mmol/L — ABNORMAL LOW (ref 101–111)
GFR calc Af Amer: 58 mL/min — ABNORMAL LOW (ref >60)
GFR calc non Af Amer: 50 mL/min — ABNORMAL LOW (ref >60)
GLUCOSE: 127 mg/dL — AB (ref 65–99)
POTASSIUM: 3.1 mmol/L — AB (ref 3.5–5.1)
Sodium: 133 mmol/L — ABNORMAL LOW (ref 135–145)
TOTAL PROTEIN: 7.3 g/dL (ref 6.5–8.1)

## 2015-07-15 LAB — CBC
HEMATOCRIT: 37.7 % (ref 36.0–46.0)
HEMOGLOBIN: 12.6 g/dL (ref 12.0–15.0)
MCH: 28.5 pg (ref 26.0–34.0)
MCHC: 33.4 g/dL (ref 30.0–36.0)
MCV: 85.3 fL (ref 78.0–100.0)
Platelets: 252 10*3/uL (ref 150–400)
RBC: 4.42 MIL/uL (ref 3.87–5.11)
RDW: 13.3 % (ref 11.5–15.5)
WBC: 17.5 10*3/uL — ABNORMAL HIGH (ref 4.0–10.5)

## 2015-07-15 LAB — URINE MICROSCOPIC-ADD ON

## 2015-07-15 LAB — I-STAT CG4 LACTIC ACID, ED
LACTIC ACID, VENOUS: 2.28 mmol/L — AB (ref 0.5–2.0)
LACTIC ACID, VENOUS: 0.81 mmol/L (ref 0.5–2.0)

## 2015-07-15 MED ORDER — NITROFURANTOIN MONOHYD MACRO 100 MG PO CAPS
100.0000 mg | ORAL_CAPSULE | Freq: Once | ORAL | Status: AC
  Administered 2015-07-15: 100 mg via ORAL
  Filled 2015-07-15: qty 1

## 2015-07-15 MED ORDER — SODIUM CHLORIDE 0.9 % IV BOLUS (SEPSIS)
1000.0000 mL | Freq: Once | INTRAVENOUS | Status: AC
  Administered 2015-07-15: 1000 mL via INTRAVENOUS

## 2015-07-15 MED ORDER — NITROFURANTOIN MONOHYD MACRO 100 MG PO CAPS: 100.0000 mg | ORAL_CAPSULE | Freq: Two times a day (BID) | ORAL | Status: DC

## 2015-07-15 MED ORDER — SODIUM CHLORIDE 0.9 % IV SOLN
Freq: Once | INTRAVENOUS | Status: AC
  Administered 2015-07-15: 15:00:00 via INTRAVENOUS

## 2015-07-15 NOTE — Discharge Instructions (Signed)

## 2015-07-15 NOTE — ED Notes (Signed)
Pt ambulated to restroom with steady gait using cane and mother by side

## 2015-07-15 NOTE — ED Notes (Signed)
Patient presents to ed c/o just not feeling well states she had UTI sx. For 2 weeks was seen by her MD on Monday and was given an antibiotic for UTI states she isn't feeling any better. States her blood pressure has been low and she is dizzy.

## 2015-07-15 NOTE — ED Provider Notes (Signed)
CSN: [id number]     Arrival date & time 07/15/15  1225 History   First MD Initiated Contact with Patient 07/15/15 1309     Chief Complaint  Patient presents with  . Dysuria     (Consider location/radiation/quality/duration/timing/severity/associated sxs/prior Treatment) HPI Comments: Pt comes in with c/o dysuria for the last 3 weeks. She was treated with cipro starting 4 days ago and the symptoms are worsening. She states that she has been having dizziness with standing and fever. Denies n/v/d. She was seen at her pcp office today and was told to come here because her blood pressure was low. She was also given the sensitivity report which showed it was not susceptible to cipro  The history is provided by the patient. No language interpreter was used.    Past Medical History  Diagnosis Date  . Heart murmur   . Asthma   . Sleep apnea   . Hypothyroidism   . Anemia   . Hypertension   . GERD (gastroesophageal reflux disease)   . Anxiety   . Depression   . PONV (postoperative nausea and vomiting)   . TMHDQQIW(979.8)    Past Surgical History  Procedure Laterality Date  . Fracture surgery      ORIF rt ankle, tendon sheath removed  . Hernia repair  2001  . Rectocele repair    . Nose surgery    . Carpal tunnel release    . Fundal      fundal plication  . Abdominoplasty     Family History  Problem Relation Age of Onset  . Hypothyroidism Mother   . Diabetes Father    Social History  Substance Use Topics  . Smoking status: Never Smoker   . Smokeless tobacco: None  . Alcohol Use: No   OB History    No data available     Review of Systems  All other systems reviewed and are negative.     Allergies  Accolate; Aspirin; Augmentin; Codeine; Erythromycin; Lyrica; and Peanut-containing drug products  Home Medications   Prior to Admission medications   Medication Sig Start Date End Date Taking? Authorizing Provider  B Complex Vitamins (B COMPLEX 1) tablet Take 1 tablet  by mouth daily.      Historical Provider, MD  Brexpiprazole (REXULTI PO) Take by mouth.    Historical Provider, MD  buPROPion (WELLBUTRIN XL) 300 MG 24 hr tablet Take 300 mg by mouth daily.    Historical Provider, MD  docusate sodium (COLACE) 100 MG capsule Take 100 mg by mouth at bedtime as needed for constipation.    Historical Provider, MD  DULoxetine (CYMBALTA) 60 MG capsule Take 60 mg by mouth 2 (two) times daily.     Historical Provider, MD  ERGOCALCIFEROL PO Take 2,500 Units by mouth daily.    Historical Provider, MD  furosemide (LASIX) 20 MG tablet Take 20 mg by mouth.    Historical Provider, MD  ibuprofen (ADVIL,MOTRIN) 200 MG tablet Take 400 mg by mouth daily as needed. For pain    Historical Provider, MD  levalbuterol [facility] HFA) 45 MCG/ACT inhaler Inhale 1-2 puffs into the lungs every 4 (four) hours as needed for wheezing. For shortness of  Breath    Historical Provider, MD  levothyroxine (SYNTHROID, LEVOTHROID) 200 MCG tablet Take 235 mcg by mouth daily.     Historical Provider, MD  Melatonin 5 MG TABS Take 3 tablets by mouth at bedtime as needed.    Historical Provider, MD  potassium chloride SA (K-DUR,KLOR-CON)  20 MEQ tablet Take 20 mEq by mouth daily.    Historical Provider, MD  rOPINIRole (REQUIP) 3 MG tablet Take 3 mg by mouth at bedtime.      Historical Provider, MD  simvastatin (ZOCOR) 20 MG tablet Take 20 mg by mouth at bedtime.      Historical Provider, MD  topiramate (TOPAMAX) 100 MG tablet Take 1 tablet by mouth 2 (two) times daily. 11/30/13   Historical Provider, MD  triamcinolone (NASACORT) 55 MCG/ACT nasal inhaler Place 2 sprays into the nose daily as needed.    Historical Provider, MD  valACYclovir (VALTREX) 1000 MG tablet Take 0.5 tablets by mouth 2 (two) times daily as needed. Fever blister on lip 11/13/13   Historical Provider, MD  zolpidem (AMBIEN) 10 MG tablet Take 10 mg by mouth at bedtime.      Historical Provider, MD   BP 96/65 mmHg  Pulse 93  Temp(Src) 98.9 F  (37.2 C) (Oral)  Resp 16  SpO2 95% Physical Exam  Constitutional: She is oriented to person, place, and time. She appears well-developed and well-nourished.  HENT:  Head: Normocephalic and atraumatic.  Cardiovascular: Normal rate and regular rhythm.   Pulmonary/Chest: Effort normal and breath sounds normal.  Abdominal: Soft. Bowel sounds are normal. There is no tenderness.  Musculoskeletal: Normal range of motion.  Neurological: She is alert and oriented to person, place, and time. Coordination normal.  Skin: Skin is warm and dry.  Psychiatric: She has a normal mood and affect.  Nursing note and vitals reviewed.   ED Course  Procedures (including critical care time) Labs Review Labs Reviewed  CBC - Abnormal; Notable for the following:    WBC 17.5 (*)    All other components within normal limits  COMPREHENSIVE METABOLIC PANEL - Abnormal; Notable for the following:    Sodium 133 (*)    Potassium 3.1 (*)    Chloride 98 (*)    Glucose, Bld 127 (*)    Creatinine, Ser 1.25 (*)    Albumin 3.1 (*)    AST 83 (*)    ALT 79 (*)    GFR calc non Af Amer 50 (*)    GFR calc Af Amer 58 (*)    All other components within normal limits  URINALYSIS, ROUTINE W REFLEX MICROSCOPIC (NOT AT [facility]) - Abnormal; Notable for the following:    Color, Urine AMBER (*)    APPearance CLOUDY (*)    Hgb urine dipstick SMALL (*)    Protein, ur 30 (*)    Nitrite POSITIVE (*)    Leukocytes, UA LARGE (*)    All other components within normal limits  URINE MICROSCOPIC-ADD ON - Abnormal; Notable for the following:    Bacteria, UA MANY (*)    All other components within normal limits  I-STAT CG4 LACTIC ACID, ED - Abnormal; Notable for the following:    Lactic Acid, Venous 2.28 (*)    All other components within normal limits  URINE CULTURE  I-STAT CG4 LACTIC ACID, ED    Imaging Review No results found. I have personally reviewed and evaluated these images and lab results as part of my medical  decision-making.   EKG Interpretation None      MDM   Final diagnoses:  Pyelonephritis  Elevated LFTs  Hypokalemia    Pt is feeling better at this time. Will send home with macrobid as pts culture were sensitive. Pt is afebrile here. Think okay to have pt go home. Discussed return precautions with  pt    Glendell Docker, NP 07/15/15 1638  Evelina Bucy, MD 07/15/15 1640

## 2015-07-15 NOTE — ED Notes (Signed)
Pt arrives via POV from home with dysuria symptoms for the last 3 weeks. Pt started on ciproflaxin Monday. Pt reports having fevers to 103 at home. Continues to feel bad.

## 2015-07-15 NOTE — ED Notes (Signed)
unabled to get urine spec. At present

## 2015-07-17 LAB — URINE CULTURE: Culture: >=100000

## 2015-07-18 ENCOUNTER — Telehealth (HOSPITAL_BASED_OUTPATIENT_CLINIC_OR_DEPARTMENT_OTHER): Payer: Self-pay | Admitting: Emergency Medicine

## 2015-07-18 NOTE — Telephone Encounter (Signed)
Post ED Visit - Positive Culture Follow-up  Culture report reviewed by antimicrobial stewardship pharmacist:  []  Heide Guile, Pharm.D., BCPS []  Alycia Rossetti, Pharm.D., BCPS []  [place], [place].D., BCPS, AAHIVP []  Legrand Com[place], Pharm.D., BCPS, AAHIVP []  [place], Pharm.D. []  Milus Glazier, Pharm.D. Anette Guarneri PharmD  Positive urine culture E. coli Treated with nitrofurantoin, organism sensitive to the same and no further patient follow-up is required at this time.  Hazle Nordmann 07/18/2015, 11:45 AM

## 2016-10-04 DIAGNOSIS — M1712 Unilateral primary osteoarthritis, left knee: Secondary | ICD-10-CM | POA: Diagnosis not present

## 2016-10-08 ENCOUNTER — Emergency Department (HOSPITAL_COMMUNITY)
Admission: EM | Admit: 2016-10-08 | Discharge: 2016-10-08 | Disposition: A | Discharge status: Home / Self Care | Payer: 59 | Attending: Emergency Medicine | Admitting: Emergency Medicine

## 2016-10-08 ENCOUNTER — Encounter (HOSPITAL_COMMUNITY): Payer: Self-pay

## 2016-10-08 DIAGNOSIS — E039 Hypothyroidism, unspecified: Secondary | ICD-10-CM | POA: Diagnosis not present

## 2016-10-08 DIAGNOSIS — Z91010 Allergy to peanuts: Secondary | ICD-10-CM | POA: Diagnosis not present

## 2016-10-08 DIAGNOSIS — R200 Anesthesia of skin: Secondary | ICD-10-CM | POA: Diagnosis present

## 2016-10-08 DIAGNOSIS — R791 Abnormal coagulation profile: Secondary | ICD-10-CM | POA: Insufficient documentation

## 2016-10-08 DIAGNOSIS — J45909 Unspecified asthma, uncomplicated: Secondary | ICD-10-CM | POA: Insufficient documentation

## 2016-10-08 DIAGNOSIS — I10 Essential (primary) hypertension: Secondary | ICD-10-CM | POA: Diagnosis not present

## 2016-10-08 DIAGNOSIS — G510 Bell's palsy: Secondary | ICD-10-CM

## 2016-10-08 LAB — I-STAT CHEM 8, ED
BUN: 14 mg/dL (ref 6–20)
CHLORIDE: 105 mmol/L (ref 101–111)
CREATININE: 1.1 mg/dL — AB (ref 0.44–1.00)
Calcium, Ion: 1.26 mmol/L (ref 1.15–1.40)
GLUCOSE: 94 mg/dL (ref 65–99)
HEMATOCRIT: 42 % (ref 36.0–46.0)
Hemoglobin: 14.3 g/dL (ref 12.0–15.0)
POTASSIUM: 3 mmol/L — AB (ref 3.5–5.1)
Sodium: 144 mmol/L (ref 135–145)
TCO2: 26 mmol/L (ref 0–100)

## 2016-10-08 LAB — I-STAT TROPONIN, ED: TROPONIN I, POC: 0 ng/mL (ref 0.00–0.08)

## 2016-10-08 LAB — COMPREHENSIVE METABOLIC PANEL
ALT: 31 U/L (ref 14–54)
AST: 29 U/L (ref 15–41)
Albumin: 4 g/dL (ref 3.5–5.0)
Alkaline Phosphatase: 76 U/L (ref 38–126)
Anion gap: 10 (ref 5–15)
BILIRUBIN TOTAL: 0.6 mg/dL (ref 0.3–1.2)
BUN: 13 mg/dL (ref 6–20)
CALCIUM: 9.6 mg/dL (ref 8.9–10.3)
CO2: 22 mmol/L (ref 22–32)
CREATININE: 1.11 mg/dL — AB (ref 0.44–1.00)
Chloride: 108 mmol/L (ref 101–111)
GFR calc Af Amer: >60 mL/min (ref >60)
GFR, EST NON AFRICAN AMERICAN: 58 mL/min — AB (ref >60)
Glucose, Bld: 90 mg/dL (ref 65–99)
Potassium: 3.4 mmol/L — ABNORMAL LOW (ref 3.5–5.1)
Sodium: 140 mmol/L (ref 135–145)
TOTAL PROTEIN: 7.7 g/dL (ref 6.5–8.1)

## 2016-10-08 LAB — DIFFERENTIAL
BASOS ABS: 0.1 10*3/uL (ref 0.0–0.1)
Basophils Relative: 0 %
Eosinophils Absolute: 0.2 10*3/uL (ref 0.0–0.7)
Eosinophils Relative: 2 %
LYMPHS ABS: 4 10*3/uL (ref 0.7–4.0)
Lymphocytes Relative: 34 %
MONOS PCT: 4 %
Monocytes Absolute: 0.5 10*3/uL (ref 0.1–1.0)
NEUTROS ABS: 7.1 10*3/uL (ref 1.7–7.7)
Neutrophils Relative %: 60 %

## 2016-10-08 LAB — APTT: APTT: 35 s (ref 24–36)

## 2016-10-08 LAB — PROTIME-INR
INR: 0.96
Prothrombin Time: 12.8 seconds (ref 11.4–15.2)

## 2016-10-08 LAB — CBC
HEMATOCRIT: 41.4 % (ref 36.0–46.0)
HEMOGLOBIN: 14.2 g/dL (ref 12.0–15.0)
MCH: 30.3 pg (ref 26.0–34.0)
MCHC: 34.3 g/dL (ref 30.0–36.0)
MCV: 88.3 fL (ref 78.0–100.0)
Platelets: 309 10*3/uL (ref 150–400)
RBC: 4.69 MIL/uL (ref 3.87–5.11)
RDW: 13 % (ref 11.5–15.5)
WBC: 11.8 10*3/uL — ABNORMAL HIGH (ref 4.0–10.5)

## 2016-10-08 MED ORDER — HYPROMELLOSE (GONIOSCOPIC) 2.5 % OP SOLN: 1.0000 [drp] | Freq: Four times a day (QID) | OPHTHALMIC | 12 refills | Status: AC | PRN

## 2016-10-08 MED ORDER — VALACYCLOVIR HCL 1 G PO TABS: 1000.0000 mg | ORAL_TABLET | Freq: Three times a day (TID) | ORAL | 0 refills | Status: AC

## 2016-10-08 MED ORDER — PREDNISONE 20 MG PO TABS: 60.0000 mg | ORAL_TABLET | Freq: Every day | ORAL | 0 refills | Status: DC

## 2016-10-08 NOTE — Discharge Instructions (Signed)
Take your medications as prescribed until completed. You may also apply artificial tear drops tear left eye to help with your eye dryness. I recommend following up with your primary care provider within the next week for follow-up evaluation. Please return to the Emergency Department if symptoms worsen or new onset of fever, headache, lightheadedness, dizziness, visual changes, significant worsening of symptoms, new numbness/tingling/weakness, confusion, change in mental status.

## 2016-10-08 NOTE — ED Provider Notes (Signed)
[facility] DEPT Provider Note   CSN: [id number] Arrival date & time: 10/08/16  1523     History   Chief Complaint Chief Complaint  Patient presents with  . Numbness    HPI Natalie Camacho is a 51 y.o. female.  HPI   Pt is a 51 yo female with PMH of HTN, HLD, depression, anemia and hypothyroidism who presents to the ED with complaint of left sided facial numbness, onset 7am. Pt reports this morning while she was brushing her teeth she noticed the middle of her tongue felt numb which she attributed to burning her tongue while drinking her coffee this morning. She notes throughout the morning she began to have gradually worsening numbness to the left side of her tongue and left side of her face. She also reports noticing mild weakness to the left side of her face over the past 1-2 hours. She also endorses having dryness to her left eye for the past 2 days. Denies any recent illness/URI. Denies fever, chills, HA, lightheadedness, dizziness, visual changes, rhinorrhea, nasal congestion, ear pain, sore throat, cough, SOB, CP, abdominal pain, N/V. Denies personal hx of CVA/TIA. Reports hx of maternal grandmother having TIAs. Denies smoking. Denies taking any medications for her sxs at home. Denies use of anticoagulants.   Past Medical History:  Diagnosis Date  . Anemia   . Anxiety   . Asthma   . Depression   . GERD (gastroesophageal reflux disease)   . Headache(784.0)   . Heart murmur   . Hypertension   . Hypothyroidism   . PONV (postoperative nausea and vomiting)   . Sleep apnea     Patient Active Problem List   Diagnosis Date Noted  . Leukocytosis 06/16/2013  . Headache(784.0) 11/27/2012  . Hypokalemia 11/27/2012  . Loose bowel movements 11/27/2012    Past Surgical History:  Procedure Laterality Date  . ABDOMINOPLASTY    . CARPAL TUNNEL RELEASE    . FRACTURE SURGERY     ORIF rt ankle, tendon sheath removed  . fundal     fundal plication  . HERNIA REPAIR  2001  .  NOSE SURGERY    . RECTOCELE REPAIR      OB History    No data available       Home Medications    Prior to Admission medications   Medication Sig Start Date End Date Taking? Authorizing Provider  acetaminophen (TYLENOL) 500 MG tablet Take 1,000 mg by mouth every 6 (six) hours as needed for headache (pain).   Yes Historical Provider, MD  albuterol (PROAIR HFA) 108 (90 Base) MCG/ACT inhaler Inhale 2 puffs into the lungs every 6 (six) hours as needed for wheezing or shortness of breath.   Yes Historical Provider, MD  Brexpiprazole (REXULTI) 1 MG TABS Take 1 mg by mouth daily.   Yes Historical Provider, MD  buPROPion (WELLBUTRIN XL) 300 MG 24 hr tablet Take 300 mg by mouth daily.   Yes Historical Provider, MD  Cholecalciferol (VITAMIN D-3) 5000 units TABS Take 5,000 Units by mouth daily.   Yes Historical Provider, MD  DULoxetine (CYMBALTA) 60 MG capsule Take 120 mg by mouth daily.    Yes Historical Provider, MD  Efinaconazole (JUBLIA) 10 % SOLN Apply 1 application topically at bedtime. Apply to toenails   Yes Historical Provider, MD  fluocinonide cream (LIDEX) [id number] % Apply 1 application topically 2 (two) times daily as needed (rash).  07/20/16  Yes Historical Provider, MD  hydrochlorothiazide (HYDRODIURIL) 25 MG tablet Take 25  mg by mouth daily. 09/30/16  Yes Historical Provider, MD  ibuprofen (ADVIL,MOTRIN) 200 MG tablet Take 400-600 mg by mouth every 6 (six) hours as needed for headache (pain).   Yes Historical Provider, MD  levothyroxine (SYNTHROID, LEVOTHROID) 200 MCG tablet Take 200 mcg by mouth daily before breakfast.    Yes Historical Provider, MD  loratadine (CLARITIN) 10 MG tablet Take 10 mg by mouth daily as needed (pet allergies).   Yes Historical Provider, MD  Multiple Vitamin (MULTIVITAMIN WITH MINERALS) TABS tablet Take 1 tablet by mouth at bedtime.   Yes Historical Provider, MD  Polyethyl Glycol-Propyl Glycol (SYSTANE OP) Place 1 drop into both eyes daily as needed (dry eyes/  irritation).   Yes Historical Provider, MD  potassium chloride SA (K-DUR,KLOR-CON) 20 MEQ tablet Take 20 mEq by mouth daily.   Yes Historical Provider, MD  PRESCRIPTION MEDICATION Inhale into the lungs at bedtime. CPAP   Yes Historical Provider, MD  rOPINIRole (REQUIP) 3 MG tablet Take 3 mg by mouth at bedtime.     Yes Historical Provider, MD  simvastatin (ZOCOR) 20 MG tablet Take 20 mg by mouth at bedtime.     Yes Historical Provider, MD  Sodium Hyaluronate, Viscosup, (SUPARTZ IX) See admin instructions. Weekly (Wednesdays) knee injections administered by Dr. Moshe Cipro, [facility] (orthopedics). 5 week course started 09/27/16   Yes Historical Provider, MD  topiramate (TOPAMAX) 100 MG tablet Take 200 mg by mouth daily.  11/30/13  Yes Historical Provider, MD  zolpidem (AMBIEN) 10 MG tablet Take 10 mg by mouth at bedtime.     Yes Historical Provider, MD  hydroxypropyl methylcellulose / hypromellose (ISOPTO TEARS / GONIOVISC) 2.5 % ophthalmic solution Place 1 drop into the left eye 4 (four) times daily as needed for dry eyes. 10/08/16   Nona Dell, PA-C  predniSONE (DELTASONE) 20 MG tablet Take 3 tablets (60 mg total) by mouth daily. 10/08/16   Nona Dell, PA-C  valACYclovir (VALTREX) 1000 MG tablet Take 1 tablet (1,000 mg total) by mouth 3 (three) times daily. 10/08/16   Nona Dell, PA-C    Family History Family History  Problem Relation Age of Onset  . Hypothyroidism Mother   . Diabetes Father     Social History Social History  Substance Use Topics  . Smoking status: Never Smoker  . Smokeless tobacco: Never Used  . Alcohol use No     Allergies   Accolate [zafirlukast]; Aspirin; Augmentin [amoxicillin-pot clavulanate]; Codeine; Erythromycin; Lyrica [pregabalin]; and Peanut-containing drug products   Review of Systems Review of Systems  Eyes:       Left eye dryness  Neurological: Positive for weakness and numbness.  All other systems  reviewed and are negative.    Physical Exam Updated Vital Signs BP 135/77   Pulse 73   Temp 97.9 F (36.6 C)   Resp 14   SpO2 98%   Physical Exam  Constitutional: She is oriented to person, place, and time. She appears well-developed and well-nourished. No distress.  HENT:  Head: Normocephalic and atraumatic.  Right Ear: Tympanic membrane normal.  Left Ear: Tympanic membrane normal.  Nose: Nose normal.  Mouth/Throat: Oropharynx is clear and moist. No oropharyngeal exudate.  Eyes: Conjunctivae and EOM are normal. Pupils are equal, round, and reactive to light. Right eye exhibits no discharge. Left eye exhibits no discharge. No scleral icterus.  Neck: Normal range of motion. Neck supple.  Cardiovascular: Normal rate, regular rhythm, normal heart sounds and intact distal pulses.  Pulmonary/Chest: Effort normal and breath sounds normal. No respiratory distress. She has no wheezes. She has no rales. She exhibits no tenderness.  Abdominal: Soft. Bowel sounds are normal. She exhibits no distension and no mass. There is no tenderness. There is no rebound and no guarding. No hernia.  Musculoskeletal: Normal range of motion. She exhibits no edema or tenderness.  Lymphadenopathy:    She has no cervical adenopathy.  Neurological: She is alert and oriented to person, place, and time. She has normal strength. A cranial nerve deficit is present. No sensory deficit. She displays a negative Romberg sign. Coordination and gait normal.  Mild sagging noted to left eyebrow. Decreased elevation of left eyebrow. Mild drooping at left corner of the mouth with smiling. Pt reports decreased sensation to entire left side of face and middle of tongue. Pt able to close both eyes completely.   Skin: Skin is warm and dry. She is not diaphoretic.  Nursing note and vitals reviewed.    ED Treatments / Results  Labs (all labs ordered are listed, but only abnormal results are displayed) Labs Reviewed  CBC -  Abnormal; Notable for the following:       Result Value   WBC 11.8 (*)    All other components within normal limits  COMPREHENSIVE METABOLIC PANEL - Abnormal; Notable for the following:    Potassium 3.4 (*)    Creatinine, Ser 1.11 (*)    GFR calc non Af Amer 58 (*)    All other components within normal limits  I-STAT CHEM 8, ED - Abnormal; Notable for the following:    Potassium 3.0 (*)    Creatinine, Ser 1.10 (*)    All other components within normal limits  PROTIME-INR  APTT  DIFFERENTIAL  I-STAT TROPOININ, ED    EKG  EKG Interpretation None       Radiology No results found.  Procedures Procedures (including critical care time)  Medications Ordered in ED Medications - No data to display   Initial Impression / Assessment and Plan / ED Course  I have reviewed the triage vital signs and the nursing notes.  Pertinent labs & imaging results that were available during my care of the patient were reviewed by me and considered in my medical decision making (see chart for details).  Clinical Course     Patient presents with left-sided facial numbness and weakness that started this morning and has gradually progressed throughout the day. Denies fever, HA, visual changes. Denies hx of CVA. VSS. Exam revealed decreased sensation to left side of face and weakness including forehead. Remaining neuro exam unremarkable. Presentation consistent with Bell's Palsy. Discussed pt with Dr. Lacinda Axon who also evaluated the pt in the ED. Labs ordered by triage nurse showed pt with K 3. Pt reports hx of hypokalemia, reports taking 56mEq potassium daily. Pt denies any other sxs associated with her potassium. Advised pt to increase her potassium dose to 10mEq for the next 2 days and schedule an appointment with her PCP this week to have her potassium rechecked. Plan to d/c pt home with valacyclovir, prednisone and artifical tears for Bell's Palsy. Discussed return precautions. Pt reports understanding  and agreement.   Final Clinical Impressions(s) / ED Diagnoses   Final diagnoses:  Bell's palsy    New Prescriptions Discharge Medication List as of 10/08/2016  5:17 PM    START taking these medications   Details  hydroxypropyl methylcellulose / hypromellose (ISOPTO TEARS / GONIOVISC) 2.5 % ophthalmic solution Place 1 drop  into the left eye 4 (four) times daily as needed for dry eyes., Starting Sun 10/08/2016, Print    predniSONE (DELTASONE) 20 MG tablet Take 3 tablets (60 mg total) by mouth daily., Starting Sun 10/08/2016, Print         [place], PA-C 10/08/16 [place]ma[place], MD 10/09/16 1308

## 2016-10-08 NOTE — ED Triage Notes (Signed)
Pt complaining of L side facial numbness. LSN 7am today. Pt with some decreased sensation to L side of body. Pt with good bilateral strength.

## 2016-10-08 NOTE — ED Notes (Signed)
Pt also complaining of L eye blurry vision.

## 2016-10-11 DIAGNOSIS — M1712 Unilateral primary osteoarthritis, left knee: Secondary | ICD-10-CM | POA: Diagnosis not present

## 2016-10-11 DIAGNOSIS — G2581 Restless legs syndrome: Secondary | ICD-10-CM | POA: Diagnosis not present

## 2016-10-16 DIAGNOSIS — S82309A Unspecified fracture of lower end of unspecified tibia, initial encounter for closed fracture: Secondary | ICD-10-CM | POA: Diagnosis not present

## 2016-10-16 DIAGNOSIS — G5600 Carpal tunnel syndrome, unspecified upper limb: Secondary | ICD-10-CM | POA: Diagnosis not present

## 2016-10-16 DIAGNOSIS — G4733 Obstructive sleep apnea (adult) (pediatric): Secondary | ICD-10-CM | POA: Diagnosis not present

## 2016-10-17 DIAGNOSIS — S82309A Unspecified fracture of lower end of unspecified tibia, initial encounter for closed fracture: Secondary | ICD-10-CM | POA: Diagnosis not present

## 2016-10-17 DIAGNOSIS — G4733 Obstructive sleep apnea (adult) (pediatric): Secondary | ICD-10-CM | POA: Diagnosis not present

## 2016-10-17 DIAGNOSIS — G510 Bell's palsy: Secondary | ICD-10-CM | POA: Diagnosis not present

## 2016-10-17 DIAGNOSIS — E876 Hypokalemia: Secondary | ICD-10-CM | POA: Diagnosis not present

## 2016-10-17 DIAGNOSIS — G5600 Carpal tunnel syndrome, unspecified upper limb: Secondary | ICD-10-CM | POA: Diagnosis not present

## 2016-10-23 DIAGNOSIS — M1712 Unilateral primary osteoarthritis, left knee: Secondary | ICD-10-CM | POA: Diagnosis not present

## 2016-10-30 DIAGNOSIS — M1712 Unilateral primary osteoarthritis, left knee: Secondary | ICD-10-CM | POA: Diagnosis not present

## 2016-11-17 DIAGNOSIS — G5600 Carpal tunnel syndrome, unspecified upper limb: Secondary | ICD-10-CM | POA: Diagnosis not present

## 2016-11-17 DIAGNOSIS — G4733 Obstructive sleep apnea (adult) (pediatric): Secondary | ICD-10-CM | POA: Diagnosis not present

## 2016-11-17 DIAGNOSIS — S82309A Unspecified fracture of lower end of unspecified tibia, initial encounter for closed fracture: Secondary | ICD-10-CM | POA: Diagnosis not present

## 2016-12-04 DIAGNOSIS — N6310 Unspecified lump in the right breast, unspecified quadrant: Secondary | ICD-10-CM | POA: Diagnosis not present

## 2016-12-04 DIAGNOSIS — R05 Cough: Secondary | ICD-10-CM | POA: Diagnosis not present

## 2016-12-04 DIAGNOSIS — R062 Wheezing: Secondary | ICD-10-CM | POA: Diagnosis not present

## 2016-12-07 ENCOUNTER — Ambulatory Visit
Admission: RE | Admit: 2016-12-07 | Discharge: 2016-12-07 | Disposition: A | Discharge status: Home / Self Care | Payer: 59 | Source: Ambulatory Visit | Attending: Family Medicine | Admitting: Family Medicine

## 2016-12-07 ENCOUNTER — Other Ambulatory Visit: Payer: Self-pay | Admitting: Family Medicine

## 2016-12-07 DIAGNOSIS — R059 Cough, unspecified: Secondary | ICD-10-CM

## 2016-12-07 DIAGNOSIS — R05 Cough: Secondary | ICD-10-CM

## 2016-12-12 ENCOUNTER — Other Ambulatory Visit: Payer: Self-pay | Admitting: Family Medicine

## 2016-12-12 DIAGNOSIS — N63 Unspecified lump in unspecified breast: Secondary | ICD-10-CM

## 2016-12-15 DIAGNOSIS — G894 Chronic pain syndrome: Secondary | ICD-10-CM | POA: Diagnosis not present

## 2016-12-15 DIAGNOSIS — G5772 Causalgia of left lower limb: Secondary | ICD-10-CM | POA: Diagnosis not present

## 2016-12-15 DIAGNOSIS — G4733 Obstructive sleep apnea (adult) (pediatric): Secondary | ICD-10-CM | POA: Diagnosis not present

## 2016-12-15 DIAGNOSIS — G5600 Carpal tunnel syndrome, unspecified upper limb: Secondary | ICD-10-CM | POA: Diagnosis not present

## 2016-12-15 DIAGNOSIS — S82309A Unspecified fracture of lower end of unspecified tibia, initial encounter for closed fracture: Secondary | ICD-10-CM | POA: Diagnosis not present

## 2016-12-15 DIAGNOSIS — G90521 Complex regional pain syndrome I of right lower limb: Secondary | ICD-10-CM | POA: Diagnosis not present

## 2016-12-18 ENCOUNTER — Ambulatory Visit
Admission: RE | Admit: 2016-12-18 | Discharge: 2016-12-18 | Disposition: A | Discharge status: Home / Self Care | Payer: 59 | Source: Ambulatory Visit | Attending: Family Medicine | Admitting: Family Medicine

## 2016-12-18 DIAGNOSIS — N63 Unspecified lump in unspecified breast: Secondary | ICD-10-CM

## 2016-12-18 DIAGNOSIS — R928 Other abnormal and inconclusive findings on diagnostic imaging of breast: Secondary | ICD-10-CM | POA: Diagnosis not present

## 2016-12-18 DIAGNOSIS — N6489 Other specified disorders of breast: Secondary | ICD-10-CM | POA: Diagnosis not present

## 2016-12-25 ENCOUNTER — Other Ambulatory Visit: Payer: Self-pay | Admitting: Surgery

## 2016-12-25 ENCOUNTER — Ambulatory Visit: Payer: Self-pay | Admitting: Surgery

## 2016-12-25 DIAGNOSIS — L99 Other disorders of skin and subcutaneous tissue in diseases classified elsewhere: Secondary | ICD-10-CM | POA: Diagnosis not present

## 2016-12-25 DIAGNOSIS — N649 Disorder of breast, unspecified: Secondary | ICD-10-CM | POA: Diagnosis not present

## 2016-12-25 DIAGNOSIS — E854 Organ-limited amyloidosis: Secondary | ICD-10-CM | POA: Diagnosis not present

## 2016-12-26 DIAGNOSIS — E039 Hypothyroidism, unspecified: Secondary | ICD-10-CM | POA: Diagnosis not present

## 2016-12-26 DIAGNOSIS — E559 Vitamin D deficiency, unspecified: Secondary | ICD-10-CM | POA: Diagnosis not present

## 2016-12-26 DIAGNOSIS — I10 Essential (primary) hypertension: Secondary | ICD-10-CM | POA: Diagnosis not present

## 2016-12-26 DIAGNOSIS — E782 Mixed hyperlipidemia: Secondary | ICD-10-CM | POA: Diagnosis not present

## 2016-12-26 DIAGNOSIS — Z79899 Other long term (current) drug therapy: Secondary | ICD-10-CM | POA: Diagnosis not present

## 2017-01-08 DIAGNOSIS — S63501A Unspecified sprain of right wrist, initial encounter: Secondary | ICD-10-CM | POA: Diagnosis not present

## 2017-01-09 DIAGNOSIS — D485 Neoplasm of uncertain behavior of skin: Secondary | ICD-10-CM | POA: Diagnosis not present

## 2017-01-11 DIAGNOSIS — G894 Chronic pain syndrome: Secondary | ICD-10-CM | POA: Diagnosis not present

## 2017-01-11 DIAGNOSIS — G5772 Causalgia of left lower limb: Secondary | ICD-10-CM | POA: Diagnosis not present

## 2017-01-11 DIAGNOSIS — G2581 Restless legs syndrome: Secondary | ICD-10-CM | POA: Diagnosis not present

## 2017-01-11 DIAGNOSIS — G90521 Complex regional pain syndrome I of right lower limb: Secondary | ICD-10-CM | POA: Diagnosis not present

## 2017-01-12 DIAGNOSIS — E8589 Other amyloidosis: Secondary | ICD-10-CM | POA: Diagnosis not present

## 2017-01-15 DIAGNOSIS — G5600 Carpal tunnel syndrome, unspecified upper limb: Secondary | ICD-10-CM | POA: Diagnosis not present

## 2017-01-15 DIAGNOSIS — S82309A Unspecified fracture of lower end of unspecified tibia, initial encounter for closed fracture: Secondary | ICD-10-CM | POA: Diagnosis not present

## 2017-01-15 DIAGNOSIS — G4733 Obstructive sleep apnea (adult) (pediatric): Secondary | ICD-10-CM | POA: Diagnosis not present

## 2017-01-26 DIAGNOSIS — G90521 Complex regional pain syndrome I of right lower limb: Secondary | ICD-10-CM | POA: Diagnosis not present

## 2017-01-26 DIAGNOSIS — G894 Chronic pain syndrome: Secondary | ICD-10-CM | POA: Diagnosis not present

## 2017-01-26 DIAGNOSIS — J45909 Unspecified asthma, uncomplicated: Secondary | ICD-10-CM | POA: Diagnosis not present

## 2017-02-02 DIAGNOSIS — L718 Other rosacea: Secondary | ICD-10-CM | POA: Diagnosis not present

## 2017-02-06 DIAGNOSIS — E8589 Other amyloidosis: Secondary | ICD-10-CM | POA: Diagnosis not present

## 2017-02-06 DIAGNOSIS — E8581 Light chain (AL) amyloidosis: Secondary | ICD-10-CM | POA: Diagnosis not present

## 2017-02-06 DIAGNOSIS — G90521 Complex regional pain syndrome I of right lower limb: Secondary | ICD-10-CM | POA: Diagnosis not present

## 2017-02-06 DIAGNOSIS — G6289 Other specified polyneuropathies: Secondary | ICD-10-CM | POA: Diagnosis not present

## 2017-02-07 DIAGNOSIS — S63501D Unspecified sprain of right wrist, subsequent encounter: Secondary | ICD-10-CM | POA: Diagnosis not present

## 2017-02-12 DIAGNOSIS — G90521 Complex regional pain syndrome I of right lower limb: Secondary | ICD-10-CM | POA: Diagnosis not present

## 2017-02-12 DIAGNOSIS — G894 Chronic pain syndrome: Secondary | ICD-10-CM | POA: Diagnosis not present

## 2017-02-12 DIAGNOSIS — J45909 Unspecified asthma, uncomplicated: Secondary | ICD-10-CM | POA: Diagnosis not present

## 2017-02-22 DIAGNOSIS — J45909 Unspecified asthma, uncomplicated: Secondary | ICD-10-CM | POA: Diagnosis not present

## 2017-02-22 DIAGNOSIS — G9050 Complex regional pain syndrome I, unspecified: Secondary | ICD-10-CM | POA: Diagnosis not present

## 2017-02-22 DIAGNOSIS — E8581 Light chain (AL) amyloidosis: Secondary | ICD-10-CM | POA: Diagnosis not present

## 2017-02-22 DIAGNOSIS — I361 Nonrheumatic tricuspid (valve) insufficiency: Secondary | ICD-10-CM | POA: Diagnosis not present

## 2017-02-22 DIAGNOSIS — C9000 Multiple myeloma not having achieved remission: Secondary | ICD-10-CM | POA: Diagnosis not present

## 2017-02-22 DIAGNOSIS — L309 Dermatitis, unspecified: Secondary | ICD-10-CM | POA: Diagnosis not present

## 2017-02-22 DIAGNOSIS — G9389 Other specified disorders of brain: Secondary | ICD-10-CM | POA: Diagnosis not present

## 2017-03-06 DIAGNOSIS — G5603 Carpal tunnel syndrome, bilateral upper limbs: Secondary | ICD-10-CM | POA: Diagnosis not present

## 2017-03-06 DIAGNOSIS — E8581 Light chain (AL) amyloidosis: Secondary | ICD-10-CM | POA: Diagnosis not present

## 2017-03-06 DIAGNOSIS — G5771 Causalgia of right lower limb: Secondary | ICD-10-CM | POA: Diagnosis not present

## 2017-03-14 DIAGNOSIS — E8581 Light chain (AL) amyloidosis: Secondary | ICD-10-CM | POA: Diagnosis not present

## 2017-03-14 DIAGNOSIS — G5732 Lesion of lateral popliteal nerve, left lower limb: Secondary | ICD-10-CM | POA: Diagnosis not present

## 2017-03-14 DIAGNOSIS — R200 Anesthesia of skin: Secondary | ICD-10-CM | POA: Diagnosis not present

## 2017-03-15 DIAGNOSIS — G5772 Causalgia of left lower limb: Secondary | ICD-10-CM | POA: Diagnosis not present

## 2017-03-15 DIAGNOSIS — G90521 Complex regional pain syndrome I of right lower limb: Secondary | ICD-10-CM | POA: Diagnosis not present

## 2017-03-15 DIAGNOSIS — M549 Dorsalgia, unspecified: Secondary | ICD-10-CM | POA: Diagnosis not present

## 2017-04-05 DIAGNOSIS — H1045 Other chronic allergic conjunctivitis: Secondary | ICD-10-CM | POA: Diagnosis not present

## 2017-04-07 DIAGNOSIS — M4802 Spinal stenosis, cervical region: Secondary | ICD-10-CM | POA: Diagnosis not present

## 2017-04-07 DIAGNOSIS — M47812 Spondylosis without myelopathy or radiculopathy, cervical region: Secondary | ICD-10-CM | POA: Diagnosis not present

## 2017-04-07 DIAGNOSIS — R200 Anesthesia of skin: Secondary | ICD-10-CM | POA: Diagnosis not present

## 2017-04-07 DIAGNOSIS — M47892 Other spondylosis, cervical region: Secondary | ICD-10-CM | POA: Diagnosis not present

## 2017-05-18 DIAGNOSIS — H04123 Dry eye syndrome of bilateral lacrimal glands: Secondary | ICD-10-CM | POA: Diagnosis not present

## 2017-05-29 DIAGNOSIS — E8581 Light chain (AL) amyloidosis: Secondary | ICD-10-CM | POA: Diagnosis not present

## 2017-05-29 DIAGNOSIS — Z885 Allergy status to narcotic agent status: Secondary | ICD-10-CM | POA: Diagnosis not present

## 2017-05-29 DIAGNOSIS — E854 Organ-limited amyloidosis: Secondary | ICD-10-CM | POA: Diagnosis not present

## 2017-05-29 DIAGNOSIS — G4733 Obstructive sleep apnea (adult) (pediatric): Secondary | ICD-10-CM | POA: Diagnosis not present

## 2017-06-06 DIAGNOSIS — M549 Dorsalgia, unspecified: Secondary | ICD-10-CM | POA: Diagnosis not present

## 2017-06-06 DIAGNOSIS — G90521 Complex regional pain syndrome I of right lower limb: Secondary | ICD-10-CM | POA: Diagnosis not present

## 2017-06-06 DIAGNOSIS — G5772 Causalgia of left lower limb: Secondary | ICD-10-CM | POA: Diagnosis not present

## 2017-06-11 DIAGNOSIS — G4733 Obstructive sleep apnea (adult) (pediatric): Secondary | ICD-10-CM | POA: Diagnosis not present

## 2017-06-11 DIAGNOSIS — G2581 Restless legs syndrome: Secondary | ICD-10-CM | POA: Diagnosis not present

## 2017-06-13 DIAGNOSIS — S82309A Unspecified fracture of lower end of unspecified tibia, initial encounter for closed fracture: Secondary | ICD-10-CM | POA: Diagnosis not present

## 2017-06-13 DIAGNOSIS — G5600 Carpal tunnel syndrome, unspecified upper limb: Secondary | ICD-10-CM | POA: Diagnosis not present

## 2017-06-13 DIAGNOSIS — G4733 Obstructive sleep apnea (adult) (pediatric): Secondary | ICD-10-CM | POA: Diagnosis not present

## 2017-06-14 DIAGNOSIS — E039 Hypothyroidism, unspecified: Secondary | ICD-10-CM | POA: Diagnosis not present

## 2017-06-14 DIAGNOSIS — Z23 Encounter for immunization: Secondary | ICD-10-CM | POA: Diagnosis not present

## 2017-06-14 DIAGNOSIS — E041 Nontoxic single thyroid nodule: Secondary | ICD-10-CM | POA: Diagnosis not present

## 2017-06-15 ENCOUNTER — Other Ambulatory Visit: Payer: Self-pay | Admitting: Family Medicine

## 2017-06-15 DIAGNOSIS — E041 Nontoxic single thyroid nodule: Secondary | ICD-10-CM

## 2017-06-28 ENCOUNTER — Ambulatory Visit
Admission: RE | Admit: 2017-06-28 | Discharge: 2017-06-28 | Disposition: A | Discharge status: Home / Self Care | Payer: 59 | Source: Ambulatory Visit | Attending: Family Medicine | Admitting: Family Medicine

## 2017-06-28 DIAGNOSIS — E041 Nontoxic single thyroid nodule: Secondary | ICD-10-CM

## 2017-06-28 DIAGNOSIS — E042 Nontoxic multinodular goiter: Secondary | ICD-10-CM | POA: Diagnosis not present

## 2017-07-03 DIAGNOSIS — S82309A Unspecified fracture of lower end of unspecified tibia, initial encounter for closed fracture: Secondary | ICD-10-CM | POA: Diagnosis not present

## 2017-07-03 DIAGNOSIS — G4733 Obstructive sleep apnea (adult) (pediatric): Secondary | ICD-10-CM | POA: Diagnosis not present

## 2017-07-03 DIAGNOSIS — G5600 Carpal tunnel syndrome, unspecified upper limb: Secondary | ICD-10-CM | POA: Diagnosis not present

## 2017-07-05 DIAGNOSIS — G5772 Causalgia of left lower limb: Secondary | ICD-10-CM | POA: Diagnosis not present

## 2017-07-05 DIAGNOSIS — J45909 Unspecified asthma, uncomplicated: Secondary | ICD-10-CM | POA: Diagnosis not present

## 2017-07-05 DIAGNOSIS — G90522 Complex regional pain syndrome I of left lower limb: Secondary | ICD-10-CM | POA: Diagnosis not present

## 2017-07-05 DIAGNOSIS — G90521 Complex regional pain syndrome I of right lower limb: Secondary | ICD-10-CM | POA: Diagnosis not present

## 2017-07-08 DIAGNOSIS — R600 Localized edema: Secondary | ICD-10-CM | POA: Diagnosis not present

## 2017-07-08 DIAGNOSIS — R5383 Other fatigue: Secondary | ICD-10-CM | POA: Diagnosis not present

## 2017-07-09 DIAGNOSIS — E876 Hypokalemia: Secondary | ICD-10-CM | POA: Diagnosis not present

## 2017-07-09 DIAGNOSIS — E039 Hypothyroidism, unspecified: Secondary | ICD-10-CM | POA: Diagnosis not present

## 2017-07-09 DIAGNOSIS — R600 Localized edema: Secondary | ICD-10-CM | POA: Diagnosis not present

## 2017-07-16 DIAGNOSIS — E039 Hypothyroidism, unspecified: Secondary | ICD-10-CM | POA: Diagnosis not present

## 2017-07-16 DIAGNOSIS — R600 Localized edema: Secondary | ICD-10-CM | POA: Diagnosis not present

## 2017-07-19 DIAGNOSIS — G5772 Causalgia of left lower limb: Secondary | ICD-10-CM | POA: Diagnosis not present

## 2017-07-19 DIAGNOSIS — J45909 Unspecified asthma, uncomplicated: Secondary | ICD-10-CM | POA: Diagnosis not present

## 2017-07-19 DIAGNOSIS — G90521 Complex regional pain syndrome I of right lower limb: Secondary | ICD-10-CM | POA: Diagnosis not present

## 2017-08-09 DIAGNOSIS — G894 Chronic pain syndrome: Secondary | ICD-10-CM | POA: Diagnosis not present

## 2017-08-09 DIAGNOSIS — G90521 Complex regional pain syndrome I of right lower limb: Secondary | ICD-10-CM | POA: Diagnosis not present

## 2017-08-09 DIAGNOSIS — J45909 Unspecified asthma, uncomplicated: Secondary | ICD-10-CM | POA: Diagnosis not present

## 2017-08-10 DIAGNOSIS — E039 Hypothyroidism, unspecified: Secondary | ICD-10-CM | POA: Diagnosis not present

## 2017-08-29 DIAGNOSIS — M549 Dorsalgia, unspecified: Secondary | ICD-10-CM | POA: Diagnosis not present

## 2017-08-29 DIAGNOSIS — G5772 Causalgia of left lower limb: Secondary | ICD-10-CM | POA: Diagnosis not present

## 2017-08-29 DIAGNOSIS — G90521 Complex regional pain syndrome I of right lower limb: Secondary | ICD-10-CM | POA: Diagnosis not present

## 2017-09-05 DIAGNOSIS — H16223 Keratoconjunctivitis sicca, not specified as Sjogren's, bilateral: Secondary | ICD-10-CM | POA: Diagnosis not present

## 2017-09-05 DIAGNOSIS — H01009 Unspecified blepharitis unspecified eye, unspecified eyelid: Secondary | ICD-10-CM | POA: Diagnosis not present

## 2017-09-18 DIAGNOSIS — Z1231 Encounter for screening mammogram for malignant neoplasm of breast: Secondary | ICD-10-CM | POA: Diagnosis not present

## 2017-09-18 DIAGNOSIS — Z01419 Encounter for gynecological examination (general) (routine) without abnormal findings: Secondary | ICD-10-CM | POA: Diagnosis not present

## 2017-10-04 DIAGNOSIS — R21 Rash and other nonspecific skin eruption: Secondary | ICD-10-CM | POA: Diagnosis not present

## 2017-10-04 DIAGNOSIS — E039 Hypothyroidism, unspecified: Secondary | ICD-10-CM | POA: Diagnosis not present

## 2017-10-04 DIAGNOSIS — R600 Localized edema: Secondary | ICD-10-CM | POA: Diagnosis not present

## 2017-10-09 ENCOUNTER — Ambulatory Visit: Payer: 59 | Admitting: Internal Medicine

## 2017-10-09 ENCOUNTER — Encounter: Payer: Self-pay | Admitting: Internal Medicine

## 2017-10-09 VITALS — BP 120/68 | HR 84 | Ht 68.5 in | Wt 244.8 lb

## 2017-10-09 DIAGNOSIS — E669 Obesity, unspecified: Secondary | ICD-10-CM | POA: Diagnosis not present

## 2017-10-09 DIAGNOSIS — E039 Hypothyroidism, unspecified: Secondary | ICD-10-CM | POA: Insufficient documentation

## 2017-10-09 DIAGNOSIS — E041 Nontoxic single thyroid nodule: Secondary | ICD-10-CM | POA: Diagnosis not present

## 2017-10-09 MED ORDER — SYNTHROID 200 MCG PO TABS: 200.0000 ug | ORAL_TABLET | Freq: Every day | ORAL | 2 refills | Status: DC

## 2017-10-09 NOTE — Patient Instructions (Addendum)
Please change to Synthroid and use 200 mcg daily.  Take the thyroid hormone every day, with water, at least 30 minutes before breakfast, separated by at least 4 hours from: - acid reflux medications - calcium - iron - multivitamins  Please look up: Rip Esselstyn - The Engine 2 diet   Rip Esselstyn - The [employer] 2 7-day Rescue diet  Please come back for labs in 5 weeks.  Please return in 3-4 months.

## 2017-10-09 NOTE — Progress Notes (Addendum)
Patient ID: Natalie Camacho, female   DOB: 05/05/1966, 52 y.o.   MRN: [id number]    HPI  Natalie Camacho is a 52 y.o.-year-old female, referred by her PCP, Dr. Stephanie Acre, for management of hypothyroidism.  Pt. has been dx with goiter hypothyroidism in 56 (52 y/o)  >> on Levothyroxine 224 mcg >> advised by PCP to decrease to 212 mcg yesterday after a TSH returned suppressed- did not start yet. She was on 200 mcg daily for a long time before.  She takes the thyroid hormone: - fasting, after 2:30-3 am if gets up at night, or at 5:30 am - with water - separated by 60 min from b'fast  - no calcium, iron, PPIs (stopped) - + MVI at bedtime - on Super B complex - last dose this am  I reviewed pt's thyroid tests: 10/08/2017: TSH 0.22 08/10/2017: TSH 0.85, free T4 1.03 07/09/2017: TSH 8.70, free T4 0.76 06/14/2017: TSH 5.51, free T4, 0.65 12/26/2016: TSH 3.08, free T4 0.8  No results found for: TSH, FREET4   Patient had a thyroid ultrasound 06/29/2017: 0.9 cm small cystic nodule in the right inferior lobe.  Pt describes: - + hair loss - + skin dry and itching - + peeling and brittle skin - + weight gain - gained 40 lbs since this summer. 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  Diet: - Breakfast: Scrambled eggs with cheese - Lunch: [employer] salad - Dinner: Chicken + beans - Snacks: 1 a day - + fatigue - no cold intolerance, + feeling hot  - + depression, + anxiety - no constipation, + diarrhea  Pt denies feeling nodules in neck, hoarseness, she does have dysphagia/but no odynophagia, SOB with lying down.  She has + FH of thyroid disorders in: hypothyroidism in mother and sister. No FH of thyroid cancer.  No h/o radiation tx to head or neck. No recent use of iodine supplements.  Pt. also has a history of GERD, iron deficiency anemia, asthma (on Albuterol, no steroids needed), OSA - CPAP. She has back pain and gets nerve blocks every 3-4 months  through the pain clinic.  ROS: Constitutional: + See HPI Eyes: no blurry vision, no xerophthalmia ENT: no sore throat, + see HPI Cardiovascular: no CP/+ SOB/no palpitations/+ leg swelling Respiratory: no cough/+ SOB/no wheezing Gastrointestinal: no N/no V/+ D/no C/no acid reflux Musculoskeletal: no muscle aches/+ joint aches Skin: + Rash, + hair loss Neurological: no tremors/no numbness/no tingling/no dizziness Psychiatric: + Both depression/anxiety  Past Medical History:  Diagnosis Date  . Anemia   . Anxiety   . Asthma   . Depression   . GERD (gastroesophageal reflux disease)   . Headache(784.0)   . Heart murmur   . Hypertension   . Hypothyroidism   . PONV (postoperative nausea and vomiting)   . Sleep apnea    Past Surgical History:  Procedure Laterality Date  . ABDOMINOPLASTY    . CARPAL TUNNEL RELEASE    . FRACTURE SURGERY     ORIF rt ankle, tendon sheath removed  . fundal     fundal plication  . HERNIA REPAIR  2001  . NOSE SURGERY    . RECTOCELE REPAIR     Social History   Socioeconomic History  . Marital status: Married    Spouse name: Not on file  . Number of children: 1  Social Needs  Occupational History  .  Retired  Tobacco Use  . Smoking status: Never  Smoker  . Smokeless tobacco: Never Used  Substance and Sexual Activity  . Alcohol use:  Liquor 3 times a year, 1-2 drinks  . Drug use: No   Current Outpatient Medications on File Prior to Visit  Medication Sig Dispense Refill  . acetaminophen (TYLENOL) 500 MG tablet Take 1,000 mg by mouth every 6 (six) hours as needed for headache (pain).    Marland Kitchen albuterol (PROAIR HFA) 108 (90 Base) MCG/ACT inhaler Inhale 2 puffs into the lungs every 6 (six) hours as needed for wheezing or shortness of breath.    . Brexpiprazole (REXULTI) 1 MG TABS Take 1 mg by mouth daily.    Marland Kitchen buPROPion (WELLBUTRIN XL) 300 MG 24 hr tablet Take 300 mg by mouth daily.    . Cholecalciferol (VITAMIN D-3) 5000 units TABS Take 5,000 Units  by mouth daily.    . DULoxetine (CYMBALTA) 60 MG capsule Take 120 mg by mouth daily.     . Efinaconazole (JUBLIA) 10 % SOLN Apply 1 application topically at bedtime. Apply to toenails    . fluocinonide cream (LIDEX) 0.35 % Apply 1 application topically 2 (two) times daily as needed (rash).   2  . hydrochlorothiazide (HYDRODIURIL) 25 MG tablet Take 25 mg by mouth daily.    . hydroxypropyl methylcellulose / hypromellose (ISOPTO TEARS / GONIOVISC) 2.5 % ophthalmic solution Place 1 drop into the left eye 4 (four) times daily as needed for dry eyes. 15 mL 12  . ibuprofen (ADVIL,MOTRIN) 200 MG tablet Take 400-600 mg by mouth every 6 (six) hours as needed for headache (pain).    Marland Kitchen levothyroxine (SYNTHROID, LEVOTHROID) 200 MCG tablet Take 200 mcg by mouth daily before breakfast.     . loratadine (CLARITIN) 10 MG tablet Take 10 mg by mouth daily as needed (pet allergies).    . Multiple Vitamin (MULTIVITAMIN WITH MINERALS) TABS tablet Take 1 tablet by mouth at bedtime.    Vladimir Faster Glycol-Propyl Glycol (SYSTANE OP) Place 1 drop into both eyes daily as needed (dry eyes/ irritation).    . potassium chloride SA (K-DUR,KLOR-CON) 20 MEQ tablet Take 20 mEq by mouth daily.    Marland Kitchen PRESCRIPTION MEDICATION Inhale into the lungs at bedtime. CPAP    . rOPINIRole (REQUIP) 3 MG tablet Take 3 mg by mouth at bedtime.      . simvastatin (ZOCOR) 20 MG tablet Take 20 mg by mouth at bedtime.      . Sodium Hyaluronate, Viscosup, (SUPARTZ IX) See admin instructions. Weekly (Wednesdays) knee injections administered by Dr. Moshe Cipro, [facility] (orthopedics). 5 week course started 09/27/16    . topiramate (TOPAMAX) 100 MG tablet Take 200 mg by mouth daily.     . valACYclovir (VALTREX) 1000 MG tablet Take 1 tablet (1,000 mg total) by mouth 3 (three) times daily. 21 tablet 0  . zolpidem (AMBIEN) 10 MG tablet Take 10 mg by mouth at bedtime.       No current facility-administered medications on file prior to visit.     Allergies  Allergen Reactions  . Accolate [Zafirlukast] Hives  . Aspirin Other (See Comments)    wheezing  . Augmentin [Amoxicillin-Pot Clavulanate] Hives and Nausea And Vomiting  . Codeine Nausea And Vomiting  . Erythromycin Nausea And Vomiting  . Lyrica [Pregabalin] Other (See Comments)    Suicidal thoughts   . Peanut-Containing Drug Products Other (See Comments)    Wheezing    Family History  Problem Relation Age of Onset  . Hypothyroidism Mother   .  Diabetes Father   Also,  heart disease in grandmother  diabetes in sister Lung cancer in grandfather  PE: BP 120/68   Pulse 84   Ht 5' 8.5" (1.74 m)   Wt 244 lb 12.8 oz (111 kg)   SpO2 96%   BMI 36.68 kg/m  Wt Readings from Last 3 Encounters:  10/09/17 244 lb 12.8 oz (111 kg)  07/02/15 177 lb 8 oz (80.5 kg)  06/30/14 208 lb 9.6 oz (94.6 kg)   Constitutional: overweight, in NAD Eyes: PERRLA, EOMI, no exophthalmos ENT: moist mucous membranes, no thyromegaly, no cervical lymphadenopathy Cardiovascular: RRR, No MRG Respiratory: CTA B Gastrointestinal: abdomen soft, NT, ND, BS+ Musculoskeletal: no deformities, strength intact in all 4 Skin: moist, warm, no rashes Neurological: no tremor with outstretched hands, DTR normal in all 4  ASSESSMENT: 1. Hypothyroidism  2.  Thyroid nodule  3.  Obesity  PLAN:  1. Patient with long-standing hypothyroidism, on levothyroxine therapy.  She is on a rather high dose of levothyroxine with fluctuating TFTs.  We discussed about possibly using brand name Synthroid to avoid fluctuations in the active substance concentration and different lots.  She agrees with this.  I also advised her to move levothyroxine in the morning since now she may take it in the middle of the night with multivitamins taken at bedtime and I am afraid that she is not absorbing well the dose taken at 2 AM.  We discussed about taking the levothyroxine after 5 AM each morning.  I advised her to start Synthroid  brand name 200 mcg daily.  I also gave her a coupon card for Synthroid. - she does not appear to have a goiter, thyroid nodules, or neck compression symptoms (except dysphagia-see below) - We discussed about correct intake of levothyroxine, fasting, with water, separated by at least 30 minutes from breakfast, and separated by more than 4 hours from calcium, iron, multivitamins, acid reflux medications (PPIs). - We reviewed her TFTs from yesterday: TSH was slightly suppressed.  We discussed that this is possibly due to the biotin dose taken that morning.  I explained that biotin can interfere with her TFT assays so I advised her to stay off the biotin for at least 5 days before we check her next set of labs, in 5 weeks.  I will also add thyroid antibodies at that time to check for Hashimoto's thyroiditis.  I explained that this is only for diagnosis purposes: Orders Placed This Encounter  Procedures  . TSH  . T4, free  . Thyroglobulin antibody  . Thyroid peroxidase antibody  -  I will see her back in 3-4 months  2.  Thyroid nodule - Reviewed together the report  of her 06/2017 thyroid ultrasound that shows a small subcentimeter cyst.  No further investigation is needed for this.  She does have some dysphagia, but she acknowledges that this is unrelated to the nodule.  She does have obstructive sleep apnea and also asthma and she feels that the dysphagia is more related to these.  I agree.  3.  Obesity - She has multiple symptoms that may be related to this problem including fatigue, fluid retention, shortness of breath, leg swelling - We discussed that these are most likely unrelated to hypothyroidism.  She is having spinal injections every 3-4 months and I wonder whether these are steroid injections since she does appear cushingoid.  I advised her to ask the pain clinic when she goes there next time exactly what substances injected. -  In the meantime, we discussed about improving her diet and I  made some suggestions  Component     Latest Ref Rng & Units 11/13/2017  Thyroperoxidase Ab SerPl-aCnc     <9 IU/mL 11 (H)  Thyroglobulin Ab     < or = 1 IU/mL <1  T4,Free(Direct)     0.60 - 1.60 ng/dL 0.97  TSH     0.35 - 4.50 uIU/mL 1.19   Normal TFTs, but new dx of Hashimoto's thyroiditis (high TPO Abs).  [name], MD PhD [facility]

## 2017-10-15 DIAGNOSIS — S82309A Unspecified fracture of lower end of unspecified tibia, initial encounter for closed fracture: Secondary | ICD-10-CM | POA: Diagnosis not present

## 2017-10-15 DIAGNOSIS — G4733 Obstructive sleep apnea (adult) (pediatric): Secondary | ICD-10-CM | POA: Diagnosis not present

## 2017-10-15 DIAGNOSIS — H01009 Unspecified blepharitis unspecified eye, unspecified eyelid: Secondary | ICD-10-CM | POA: Diagnosis not present

## 2017-10-15 DIAGNOSIS — G5600 Carpal tunnel syndrome, unspecified upper limb: Secondary | ICD-10-CM | POA: Diagnosis not present

## 2017-10-15 DIAGNOSIS — H16223 Keratoconjunctivitis sicca, not specified as Sjogren's, bilateral: Secondary | ICD-10-CM | POA: Diagnosis not present

## 2017-11-13 ENCOUNTER — Other Ambulatory Visit (INDEPENDENT_AMBULATORY_CARE_PROVIDER_SITE_OTHER): Payer: 59

## 2017-11-13 DIAGNOSIS — E039 Hypothyroidism, unspecified: Secondary | ICD-10-CM | POA: Diagnosis not present

## 2017-11-13 LAB — T4, FREE: FREE T4: 0.97 ng/dL (ref 0.60–1.60)

## 2017-11-13 LAB — TSH: TSH: 1.19 u[IU]/mL (ref 0.35–4.50)

## 2017-11-14 LAB — THYROID PEROXIDASE ANTIBODY: Thyroperoxidase Ab SerPl-aCnc: 11 IU/mL — ABNORMAL HIGH (ref <9)

## 2017-11-14 LAB — THYROGLOBULIN ANTIBODY: Thyroglobulin Ab: <1 IU/mL (ref <=1)

## 2017-11-21 DIAGNOSIS — G90521 Complex regional pain syndrome I of right lower limb: Secondary | ICD-10-CM | POA: Diagnosis not present

## 2017-11-21 DIAGNOSIS — H02889 Meibomian gland dysfunction of unspecified eye, unspecified eyelid: Secondary | ICD-10-CM | POA: Diagnosis not present

## 2017-11-21 DIAGNOSIS — G5772 Causalgia of left lower limb: Secondary | ICD-10-CM | POA: Diagnosis not present

## 2017-11-21 DIAGNOSIS — M549 Dorsalgia, unspecified: Secondary | ICD-10-CM | POA: Diagnosis not present

## 2017-11-29 DIAGNOSIS — R600 Localized edema: Secondary | ICD-10-CM | POA: Diagnosis not present

## 2017-11-29 DIAGNOSIS — B359 Dermatophytosis, unspecified: Secondary | ICD-10-CM | POA: Diagnosis not present

## 2017-12-25 DIAGNOSIS — M79672 Pain in left foot: Secondary | ICD-10-CM | POA: Diagnosis not present

## 2017-12-25 DIAGNOSIS — M7662 Achilles tendinitis, left leg: Secondary | ICD-10-CM | POA: Diagnosis not present

## 2017-12-27 ENCOUNTER — Other Ambulatory Visit: Payer: Self-pay

## 2017-12-27 DIAGNOSIS — G5772 Causalgia of left lower limb: Secondary | ICD-10-CM | POA: Diagnosis not present

## 2017-12-27 DIAGNOSIS — D485 Neoplasm of uncertain behavior of skin: Secondary | ICD-10-CM | POA: Diagnosis not present

## 2017-12-27 DIAGNOSIS — L308 Other specified dermatitis: Secondary | ICD-10-CM | POA: Diagnosis not present

## 2017-12-27 DIAGNOSIS — G90521 Complex regional pain syndrome I of right lower limb: Secondary | ICD-10-CM | POA: Diagnosis not present

## 2017-12-27 DIAGNOSIS — G894 Chronic pain syndrome: Secondary | ICD-10-CM | POA: Diagnosis not present

## 2017-12-27 DIAGNOSIS — L959 Vasculitis limited to the skin, unspecified: Secondary | ICD-10-CM | POA: Diagnosis not present

## 2018-01-10 DIAGNOSIS — J45909 Unspecified asthma, uncomplicated: Secondary | ICD-10-CM | POA: Diagnosis not present

## 2018-01-10 DIAGNOSIS — G90521 Complex regional pain syndrome I of right lower limb: Secondary | ICD-10-CM | POA: Diagnosis not present

## 2018-01-10 DIAGNOSIS — E859 Amyloidosis, unspecified: Secondary | ICD-10-CM | POA: Diagnosis not present

## 2018-01-15 DIAGNOSIS — G4733 Obstructive sleep apnea (adult) (pediatric): Secondary | ICD-10-CM | POA: Diagnosis not present

## 2018-01-15 DIAGNOSIS — G5600 Carpal tunnel syndrome, unspecified upper limb: Secondary | ICD-10-CM | POA: Diagnosis not present

## 2018-01-15 DIAGNOSIS — S82309A Unspecified fracture of lower end of unspecified tibia, initial encounter for closed fracture: Secondary | ICD-10-CM | POA: Diagnosis not present

## 2018-01-21 ENCOUNTER — Ambulatory Visit: Payer: 59 | Admitting: Internal Medicine

## 2018-01-21 ENCOUNTER — Encounter: Payer: Self-pay | Admitting: Internal Medicine

## 2018-01-21 VITALS — BP 124/72 | HR 76 | Ht 68.5 in | Wt 243.0 lb

## 2018-01-21 DIAGNOSIS — E041 Nontoxic single thyroid nodule: Secondary | ICD-10-CM | POA: Diagnosis not present

## 2018-01-21 DIAGNOSIS — E669 Obesity, unspecified: Secondary | ICD-10-CM | POA: Diagnosis not present

## 2018-01-21 DIAGNOSIS — E039 Hypothyroidism, unspecified: Secondary | ICD-10-CM

## 2018-01-21 LAB — T4, FREE: Free T4: 0.91 ng/dL (ref 0.60–1.60)

## 2018-01-21 LAB — TSH: TSH: 1.95 u[IU]/mL (ref 0.35–4.50)

## 2018-01-21 NOTE — Patient Instructions (Signed)
Please continue Synthroid  200 mcg daily.  Take the thyroid hormone every day, with water, at least 30 minutes before breakfast, separated by at least 4 hours from: - acid reflux medications - calcium - iron - multivitamins  Please return in 6 months.    

## 2018-01-21 NOTE — Progress Notes (Signed)
Patient ID: Natalie Camacho, female   DOB: 1966/01/09, 52 y.o.   MRN: [id number]    HPI  Natalie Camacho is a 52 y.o.-year-old female, returning for follow-up for uncontrolled hypothyroidism and thyroid cyst.  Last visit 3 months ago.  Pt. has been dx with goiter hypothyroidism in 50 (52 y/o)  >> on Levothyroxine 224 mcg >> currently decreased to 200 mcg (d.a.w.)  Pt takes the Synthroid: - 5 am - fasting - at least 30 min from b'fast (usually 7 am) - no Ca, Fe, PPIs - + MVI at 9 am - on Biotin - not in last 2 days  Reviewed patient's TFTs: Lab Results  Component Value Date   TSH 1.19 11/13/2017   FREET4 0.97 11/13/2017  10/08/2017: TSH 0.22 -she was on biotin at the time 08/10/2017: TSH 0.85, free T4 1.03 07/09/2017: TSH 8.70, free T4 0.76 06/14/2017: TSH 5.51, free T4, 0.65 12/26/2016: TSH 3.08, free T4 0.8   Patient had a thyroid ultrasound 06/29/2017:  0.9 small cystic nodule in the right inferior lobe  Pt describes: - + hair loss - + skin dry and itching - + peeling and brittle skin - + weight gain - gained 40 lbs since this summer. 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  Diet: - Breakfast: Scrambled eggs with cheese - Lunch: [employer] salad - Dinner: Chicken + beans - Snacks: 1 a day - + fatigue - no cold intolerance, + feeling hot  - + depression, + anxiety - no constipation, + diarrhea  Pt denies: - feeling nodules in neck - hoarseness - dysphagia - choking - SOB with lying down  She has + FH of thyroid disorders in: hypothyroidism in mother and sister. No FH of thyroid cancer. No h/o radiation tx to head or neck.  No seaweed or kelp. No recent contrast studies. No herbal supplements. No Biotin use. No recent steroids use.   Pt. also has a history of GERD, iron deficiency anemia, asthma (on Albuterol, no steroids needed), OSA - CPAP. She has back pain and gets nerve blocks every 3-4 months through the pain  clinic.  ROS: Constitutional: + weight gain/+ weight loss, no fatigue, + subjective hyperthermia, no subjective hypothermia Eyes: no blurry vision, no xerophthalmia ENT: no sore throat, + see HPI Cardiovascular: no CP/no SOB/no palpitations/+ leg swelling Respiratory: no cough/no SOB/no wheezing Gastrointestinal: no N/no V/no D/no C/no acid reflux Musculoskeletal: no muscle aches/no joint aches Skin: + rashes, + hair loss Neurological: no tremors/no numbness/no tingling/no dizziness  I reviewed pt's medications, allergies, PMH, social hx, family hx, and changes were documented in the history of present illness. Otherwise, unchanged from my initial visit note.  Past Medical History:  Diagnosis Date  . Anemia   . Anxiety   . Asthma   . Depression   . GERD (gastroesophageal reflux disease)   . Headache(784.0)   . Heart murmur   . Hypertension   . Hypothyroidism   . PONV (postoperative nausea and vomiting)   . Sleep apnea    Past Surgical History:  Procedure Laterality Date  . ABDOMINOPLASTY    . CARPAL TUNNEL RELEASE    . FRACTURE SURGERY     ORIF rt ankle, tendon sheath removed  . fundal     fundal plication  . HERNIA REPAIR  2001  . NOSE SURGERY    . RECTOCELE REPAIR     Social History   Socioeconomic History  . Marital status:  Married    Spouse name: Not on file  . Number of children: 1  Social Needs  Occupational History  .  Retired  Tobacco Use  . Smoking status: Never Smoker  . Smokeless tobacco: Never Used  Substance and Sexual Activity  . Alcohol use:  Liquor 3 times a year, 1-2 drinks  . Drug use: No   Current Outpatient Medications on File Prior to Visit  Medication Sig Dispense Refill  . acetaminophen (TYLENOL) 500 MG tablet Take 1,000 mg by mouth every 6 (six) hours as needed for headache (pain).    Marland Kitchen albuterol (PROAIR HFA) 108 (90 Base) MCG/ACT inhaler Inhale 2 puffs into the lungs every 6 (six) hours as needed for wheezing or shortness of breath.     . Brexpiprazole (REXULTI) 1 MG TABS Take 1 mg by mouth daily.    Marland Kitchen buPROPion (WELLBUTRIN XL) 300 MG 24 hr tablet Take 300 mg by mouth daily.    . Cholecalciferol (VITAMIN D-3) 5000 units TABS Take 5,000 Units by mouth daily.    . DULoxetine (CYMBALTA) 60 MG capsule Take 120 mg by mouth daily.     . fluocinonide cream (LIDEX) 4.09 % Apply 1 application topically 2 (two) times daily as needed (rash).   2  . hydrochlorothiazide (HYDRODIURIL) 25 MG tablet Take 25 mg by mouth daily.    . hydroxypropyl methylcellulose / hypromellose (ISOPTO TEARS / GONIOVISC) 2.5 % ophthalmic solution Place 1 drop into the left eye 4 (four) times daily as needed for dry eyes. 15 mL 12  . ibuprofen (ADVIL,MOTRIN) 200 MG tablet Take 400-600 mg by mouth every 6 (six) hours as needed for headache (pain).    Marland Kitchen loratadine (CLARITIN) 10 MG tablet Take 10 mg by mouth daily as needed (pet allergies).    . Multiple Vitamin (MULTIVITAMIN WITH MINERALS) TABS tablet Take 1 tablet by mouth at bedtime.    Vladimir Faster Glycol-Propyl Glycol (SYSTANE OP) Place 1 drop into both eyes daily as needed (dry eyes/ irritation).    . potassium chloride SA (K-DUR,KLOR-CON) 20 MEQ tablet Take 20 mEq by mouth daily.    Marland Kitchen PRESCRIPTION MEDICATION Inhale into the lungs at bedtime. CPAP    . rOPINIRole (REQUIP) 3 MG tablet Take 3 mg by mouth at bedtime.      . simvastatin (ZOCOR) 20 MG tablet Take 20 mg by mouth at bedtime.      Marland Kitchen SYNTHROID 200 MCG tablet Take 1 tablet (200 mcg total) by mouth daily before breakfast. 45 tablet 2  . topiramate (TOPAMAX) 100 MG tablet Take 200 mg by mouth daily.     . valACYclovir (VALTREX) 1000 MG tablet Take 1 tablet (1,000 mg total) by mouth 3 (three) times daily. 21 tablet 0  . zolpidem (AMBIEN) 10 MG tablet Take 10 mg by mouth at bedtime.       No current facility-administered medications on file prior to visit.    Allergies  Allergen Reactions  . Accolate [Zafirlukast] Hives  . Aspirin Other (See Comments)     wheezing  . Augmentin [Amoxicillin-Pot Clavulanate] Hives and Nausea And Vomiting  . Codeine Nausea And Vomiting  . Erythromycin Nausea And Vomiting  . Lyrica [Pregabalin] Other (See Comments)    Suicidal thoughts   . Peanut-Containing Drug Products Other (See Comments)    Wheezing    Family History  Problem Relation Age of Onset  . Hypothyroidism Mother   . Diabetes Father   Also,  heart disease in grandmother  diabetes in  sister Lung cancer in grandfather  PE: BP 124/72   Pulse 76   Ht 5' 8.5" (1.74 m)   Wt 243 lb (110.2 kg)   SpO2 98%   BMI 36.41 kg/m  Wt Readings from Last 3 Encounters:  01/21/18 243 lb (110.2 kg)  10/09/17 244 lb 12.8 oz (111 kg)  07/02/15 177 lb 8 oz (80.5 kg)   Constitutional: overweight, in NAD Eyes: PERRLA, EOMI, no exophthalmos ENT: moist mucous membranes, no thyromegaly, no cervical lymphadenopathy Cardiovascular: RRR, No MRG Respiratory: CTA B Gastrointestinal: abdomen soft, NT, ND, BS+ Musculoskeletal: no deformities, strength intact in all 4 Skin: moist, warm, no rashes Neurological: no tremor with outstretched hands, DTR normal in all 4  ASSESSMENT: 1. Hypothyroidism  2.  Thyroid nodule  3.  Obesity  PLAN:  1. Patient with long-standing hypothyroidism, on levothyroxine therapy.  Last visit, we diagnosed Hashimoto's thyroiditis based on the high TPO antibodies.  - latest thyroid labs reviewed with pt >> normal  - she continues on Synthroid 200 Mcg daily - pt feels good on this dose. - we discussed about taking the thyroid hormone every day, with water, >30 minutes before breakfast, separated by >4 hours from acid reflux medications, calcium, iron, multivitamins. Pt. is taking it correctly. - will check thyroid tests today: TSH and fT4 - If labs are abnormal, she will need to return for repeat TFTs in 1.5 months  2.  Thyroid nodule -Reviewed together the report of her 06/2017 thyroid ultrasound that showed a small  subcentimeter cyst.  I do not feel she needs further investigation for this.  No neck compression symptoms.   - She does have OSA and also asthma  3. Obesity - she lost 17 lbs (reduced portions) but gained almost all back - discussed about restricted eating, avoiding peer pressure when it comes to eating, eating earlier dinners  Component     Latest Ref Rng & Units 01/21/2018  T4,Free(Direct)     0.60 - 1.60 ng/dL 0.91  TSH     0.35 - 4.50 uIU/mL 1.95   TFTs normal.  [facility]

## 2018-01-22 ENCOUNTER — Telehealth: Payer: Self-pay

## 2018-01-22 NOTE — Telephone Encounter (Signed)
Called patient. Gave lab results. Patient verbalized understanding.  

## 2018-01-22 NOTE — Telephone Encounter (Signed)
-----   Message from [name], MD sent at 01/21/2018  5:01 PM EDT ----- Natalie Camacho, can you please call pt: TFTs are normal. Continue current Synthroid dose.

## 2018-01-31 DIAGNOSIS — Z1211 Encounter for screening for malignant neoplasm of colon: Secondary | ICD-10-CM | POA: Diagnosis not present

## 2018-01-31 DIAGNOSIS — K219 Gastro-esophageal reflux disease without esophagitis: Secondary | ICD-10-CM | POA: Diagnosis not present

## 2018-01-31 DIAGNOSIS — K5901 Slow transit constipation: Secondary | ICD-10-CM | POA: Diagnosis not present

## 2018-02-11 DIAGNOSIS — M7712 Lateral epicondylitis, left elbow: Secondary | ICD-10-CM | POA: Diagnosis not present

## 2018-02-13 DIAGNOSIS — M549 Dorsalgia, unspecified: Secondary | ICD-10-CM | POA: Diagnosis not present

## 2018-02-13 DIAGNOSIS — G5772 Causalgia of left lower limb: Secondary | ICD-10-CM | POA: Diagnosis not present

## 2018-02-13 DIAGNOSIS — G90521 Complex regional pain syndrome I of right lower limb: Secondary | ICD-10-CM | POA: Diagnosis not present

## 2018-02-15 ENCOUNTER — Other Ambulatory Visit: Payer: Self-pay | Admitting: Internal Medicine

## 2018-02-20 DIAGNOSIS — I519 Heart disease, unspecified: Secondary | ICD-10-CM | POA: Diagnosis not present

## 2018-02-20 DIAGNOSIS — E854 Organ-limited amyloidosis: Secondary | ICD-10-CM | POA: Diagnosis not present

## 2018-02-21 DIAGNOSIS — G90521 Complex regional pain syndrome I of right lower limb: Secondary | ICD-10-CM | POA: Diagnosis not present

## 2018-02-21 DIAGNOSIS — E859 Amyloidosis, unspecified: Secondary | ICD-10-CM | POA: Diagnosis not present

## 2018-02-21 DIAGNOSIS — G2581 Restless legs syndrome: Secondary | ICD-10-CM | POA: Diagnosis not present

## 2018-03-03 DIAGNOSIS — J209 Acute bronchitis, unspecified: Secondary | ICD-10-CM | POA: Diagnosis not present

## 2018-03-25 DIAGNOSIS — D126 Benign neoplasm of colon, unspecified: Secondary | ICD-10-CM | POA: Diagnosis not present

## 2018-03-25 DIAGNOSIS — Z1211 Encounter for screening for malignant neoplasm of colon: Secondary | ICD-10-CM | POA: Diagnosis not present

## 2018-03-27 DIAGNOSIS — M7712 Lateral epicondylitis, left elbow: Secondary | ICD-10-CM | POA: Diagnosis not present

## 2018-03-28 ENCOUNTER — Other Ambulatory Visit: Payer: Self-pay

## 2018-03-28 MED ORDER — SYNTHROID 200 MCG PO TABS: ORAL_TABLET | ORAL | 0 refills | Status: DC

## 2018-03-30 DIAGNOSIS — M25522 Pain in left elbow: Secondary | ICD-10-CM | POA: Diagnosis not present

## 2018-04-01 ENCOUNTER — Other Ambulatory Visit: Payer: Self-pay

## 2018-04-01 MED ORDER — SYNTHROID 200 MCG PO TABS: ORAL_TABLET | ORAL | 0 refills | Status: DC

## 2018-04-03 DIAGNOSIS — M7712 Lateral epicondylitis, left elbow: Secondary | ICD-10-CM | POA: Diagnosis not present

## 2018-04-04 DIAGNOSIS — Z719 Counseling, unspecified: Secondary | ICD-10-CM | POA: Diagnosis not present

## 2018-04-05 DIAGNOSIS — M7712 Lateral epicondylitis, left elbow: Secondary | ICD-10-CM | POA: Diagnosis not present

## 2018-04-05 DIAGNOSIS — M25522 Pain in left elbow: Secondary | ICD-10-CM | POA: Diagnosis not present

## 2018-04-08 DIAGNOSIS — M25522 Pain in left elbow: Secondary | ICD-10-CM | POA: Diagnosis not present

## 2018-04-08 DIAGNOSIS — M7712 Lateral epicondylitis, left elbow: Secondary | ICD-10-CM | POA: Diagnosis not present

## 2018-04-10 DIAGNOSIS — M7712 Lateral epicondylitis, left elbow: Secondary | ICD-10-CM | POA: Diagnosis not present

## 2018-04-10 DIAGNOSIS — M25522 Pain in left elbow: Secondary | ICD-10-CM | POA: Diagnosis not present

## 2018-04-15 DIAGNOSIS — M25522 Pain in left elbow: Secondary | ICD-10-CM | POA: Diagnosis not present

## 2018-04-15 DIAGNOSIS — M7712 Lateral epicondylitis, left elbow: Secondary | ICD-10-CM | POA: Diagnosis not present

## 2018-04-17 DIAGNOSIS — M25522 Pain in left elbow: Secondary | ICD-10-CM | POA: Diagnosis not present

## 2018-04-17 DIAGNOSIS — Z719 Counseling, unspecified: Secondary | ICD-10-CM | POA: Diagnosis not present

## 2018-04-17 DIAGNOSIS — M7712 Lateral epicondylitis, left elbow: Secondary | ICD-10-CM | POA: Diagnosis not present

## 2018-04-18 DIAGNOSIS — G4733 Obstructive sleep apnea (adult) (pediatric): Secondary | ICD-10-CM | POA: Diagnosis not present

## 2018-04-18 DIAGNOSIS — S82309A Unspecified fracture of lower end of unspecified tibia, initial encounter for closed fracture: Secondary | ICD-10-CM | POA: Diagnosis not present

## 2018-04-18 DIAGNOSIS — G5600 Carpal tunnel syndrome, unspecified upper limb: Secondary | ICD-10-CM | POA: Diagnosis not present

## 2018-04-22 DIAGNOSIS — M25522 Pain in left elbow: Secondary | ICD-10-CM | POA: Diagnosis not present

## 2018-04-22 DIAGNOSIS — M7712 Lateral epicondylitis, left elbow: Secondary | ICD-10-CM | POA: Diagnosis not present

## 2018-04-24 DIAGNOSIS — M7712 Lateral epicondylitis, left elbow: Secondary | ICD-10-CM | POA: Diagnosis not present

## 2018-04-24 DIAGNOSIS — Z719 Counseling, unspecified: Secondary | ICD-10-CM | POA: Diagnosis not present

## 2018-04-24 DIAGNOSIS — M25522 Pain in left elbow: Secondary | ICD-10-CM | POA: Diagnosis not present

## 2018-04-26 ENCOUNTER — Other Ambulatory Visit: Payer: Self-pay | Admitting: Internal Medicine

## 2018-04-29 DIAGNOSIS — M25522 Pain in left elbow: Secondary | ICD-10-CM | POA: Diagnosis not present

## 2018-04-29 DIAGNOSIS — M7712 Lateral epicondylitis, left elbow: Secondary | ICD-10-CM | POA: Diagnosis not present

## 2018-05-01 DIAGNOSIS — M7712 Lateral epicondylitis, left elbow: Secondary | ICD-10-CM | POA: Diagnosis not present

## 2018-05-01 DIAGNOSIS — M25522 Pain in left elbow: Secondary | ICD-10-CM | POA: Diagnosis not present

## 2018-05-01 DIAGNOSIS — Z719 Counseling, unspecified: Secondary | ICD-10-CM | POA: Diagnosis not present

## 2018-05-06 DIAGNOSIS — E782 Mixed hyperlipidemia: Secondary | ICD-10-CM | POA: Diagnosis not present

## 2018-05-06 DIAGNOSIS — D509 Iron deficiency anemia, unspecified: Secondary | ICD-10-CM | POA: Diagnosis not present

## 2018-05-06 DIAGNOSIS — M25522 Pain in left elbow: Secondary | ICD-10-CM | POA: Diagnosis not present

## 2018-05-06 DIAGNOSIS — E559 Vitamin D deficiency, unspecified: Secondary | ICD-10-CM | POA: Diagnosis not present

## 2018-05-06 DIAGNOSIS — M7712 Lateral epicondylitis, left elbow: Secondary | ICD-10-CM | POA: Diagnosis not present

## 2018-05-06 DIAGNOSIS — Z0000 Encounter for general adult medical examination without abnormal findings: Secondary | ICD-10-CM | POA: Diagnosis not present

## 2018-05-06 DIAGNOSIS — Z23 Encounter for immunization: Secondary | ICD-10-CM | POA: Diagnosis not present

## 2018-05-08 DIAGNOSIS — G90521 Complex regional pain syndrome I of right lower limb: Secondary | ICD-10-CM | POA: Diagnosis not present

## 2018-05-08 DIAGNOSIS — M7712 Lateral epicondylitis, left elbow: Secondary | ICD-10-CM | POA: Diagnosis not present

## 2018-05-08 DIAGNOSIS — M25522 Pain in left elbow: Secondary | ICD-10-CM | POA: Diagnosis not present

## 2018-05-08 DIAGNOSIS — G5772 Causalgia of left lower limb: Secondary | ICD-10-CM | POA: Diagnosis not present

## 2018-05-08 DIAGNOSIS — Z719 Counseling, unspecified: Secondary | ICD-10-CM | POA: Diagnosis not present

## 2018-05-08 DIAGNOSIS — M549 Dorsalgia, unspecified: Secondary | ICD-10-CM | POA: Diagnosis not present

## 2018-05-13 DIAGNOSIS — S70362A Insect bite (nonvenomous), left thigh, initial encounter: Secondary | ICD-10-CM | POA: Diagnosis not present

## 2018-05-13 DIAGNOSIS — S30860A Insect bite (nonvenomous) of lower back and pelvis, initial encounter: Secondary | ICD-10-CM | POA: Diagnosis not present

## 2018-05-13 DIAGNOSIS — M25522 Pain in left elbow: Secondary | ICD-10-CM | POA: Diagnosis not present

## 2018-05-13 DIAGNOSIS — M7712 Lateral epicondylitis, left elbow: Secondary | ICD-10-CM | POA: Diagnosis not present

## 2018-05-13 DIAGNOSIS — S70361A Insect bite (nonvenomous), right thigh, initial encounter: Secondary | ICD-10-CM | POA: Diagnosis not present

## 2018-05-14 DIAGNOSIS — L988 Other specified disorders of the skin and subcutaneous tissue: Secondary | ICD-10-CM | POA: Diagnosis not present

## 2018-05-14 DIAGNOSIS — L308 Other specified dermatitis: Secondary | ICD-10-CM | POA: Diagnosis not present

## 2018-05-15 DIAGNOSIS — Z719 Counseling, unspecified: Secondary | ICD-10-CM | POA: Diagnosis not present

## 2018-05-15 DIAGNOSIS — M25522 Pain in left elbow: Secondary | ICD-10-CM | POA: Diagnosis not present

## 2018-05-15 DIAGNOSIS — M7712 Lateral epicondylitis, left elbow: Secondary | ICD-10-CM | POA: Diagnosis not present

## 2018-05-17 DIAGNOSIS — N390 Urinary tract infection, site not specified: Secondary | ICD-10-CM | POA: Diagnosis not present

## 2018-05-17 DIAGNOSIS — R351 Nocturia: Secondary | ICD-10-CM | POA: Diagnosis not present

## 2018-05-17 DIAGNOSIS — N3946 Mixed incontinence: Secondary | ICD-10-CM | POA: Diagnosis not present

## 2018-05-17 DIAGNOSIS — B964 Proteus (mirabilis) (morganii) as the cause of diseases classified elsewhere: Secondary | ICD-10-CM | POA: Diagnosis not present

## 2018-05-20 ENCOUNTER — Encounter: Payer: Self-pay | Admitting: Internal Medicine

## 2018-05-20 DIAGNOSIS — M7712 Lateral epicondylitis, left elbow: Secondary | ICD-10-CM | POA: Diagnosis not present

## 2018-05-20 DIAGNOSIS — M25522 Pain in left elbow: Secondary | ICD-10-CM | POA: Diagnosis not present

## 2018-05-20 NOTE — Progress Notes (Signed)
Received labs drawn on 05/06/2018: CBC normal CMP normal with the exception of glucose 111, BUN/creatinine 20/1.21, GFR 47 TSH 0.57, free T4 1.06 Vitamin D 72.7 Lipids: 137/148/42/65

## 2018-05-22 DIAGNOSIS — M25522 Pain in left elbow: Secondary | ICD-10-CM | POA: Diagnosis not present

## 2018-05-22 DIAGNOSIS — M7712 Lateral epicondylitis, left elbow: Secondary | ICD-10-CM | POA: Diagnosis not present

## 2018-05-22 DIAGNOSIS — Z719 Counseling, unspecified: Secondary | ICD-10-CM | POA: Diagnosis not present

## 2018-05-28 DIAGNOSIS — M7712 Lateral epicondylitis, left elbow: Secondary | ICD-10-CM | POA: Diagnosis not present

## 2018-05-28 DIAGNOSIS — M25522 Pain in left elbow: Secondary | ICD-10-CM | POA: Diagnosis not present

## 2018-05-29 DIAGNOSIS — M7712 Lateral epicondylitis, left elbow: Secondary | ICD-10-CM | POA: Diagnosis not present

## 2018-05-29 DIAGNOSIS — M25522 Pain in left elbow: Secondary | ICD-10-CM | POA: Diagnosis not present

## 2018-05-30 DIAGNOSIS — E859 Amyloidosis, unspecified: Secondary | ICD-10-CM | POA: Diagnosis not present

## 2018-05-30 DIAGNOSIS — G90521 Complex regional pain syndrome I of right lower limb: Secondary | ICD-10-CM | POA: Diagnosis not present

## 2018-06-05 DIAGNOSIS — Z719 Counseling, unspecified: Secondary | ICD-10-CM | POA: Diagnosis not present

## 2018-06-07 ENCOUNTER — Other Ambulatory Visit: Payer: Self-pay | Admitting: Internal Medicine

## 2018-06-10 DIAGNOSIS — G4733 Obstructive sleep apnea (adult) (pediatric): Secondary | ICD-10-CM | POA: Diagnosis not present

## 2018-06-12 DIAGNOSIS — Z719 Counseling, unspecified: Secondary | ICD-10-CM | POA: Diagnosis not present

## 2018-06-13 DIAGNOSIS — G4733 Obstructive sleep apnea (adult) (pediatric): Secondary | ICD-10-CM | POA: Diagnosis not present

## 2018-06-13 DIAGNOSIS — J45909 Unspecified asthma, uncomplicated: Secondary | ICD-10-CM | POA: Diagnosis not present

## 2018-06-13 DIAGNOSIS — G90521 Complex regional pain syndrome I of right lower limb: Secondary | ICD-10-CM | POA: Diagnosis not present

## 2018-06-17 DIAGNOSIS — H01114 Allergic dermatitis of left upper eyelid: Secondary | ICD-10-CM | POA: Diagnosis not present

## 2018-06-17 DIAGNOSIS — H01115 Allergic dermatitis of left lower eyelid: Secondary | ICD-10-CM | POA: Diagnosis not present

## 2018-06-19 DIAGNOSIS — Z719 Counseling, unspecified: Secondary | ICD-10-CM | POA: Diagnosis not present

## 2018-06-26 DIAGNOSIS — Z719 Counseling, unspecified: Secondary | ICD-10-CM | POA: Diagnosis not present

## 2018-06-27 DIAGNOSIS — G894 Chronic pain syndrome: Secondary | ICD-10-CM | POA: Diagnosis not present

## 2018-06-27 DIAGNOSIS — G90521 Complex regional pain syndrome I of right lower limb: Secondary | ICD-10-CM | POA: Diagnosis not present

## 2018-06-27 DIAGNOSIS — N390 Urinary tract infection, site not specified: Secondary | ICD-10-CM | POA: Diagnosis not present

## 2018-06-27 DIAGNOSIS — I10 Essential (primary) hypertension: Secondary | ICD-10-CM | POA: Diagnosis not present

## 2018-06-27 DIAGNOSIS — R8271 Bacteriuria: Secondary | ICD-10-CM | POA: Diagnosis not present

## 2018-06-27 DIAGNOSIS — N3946 Mixed incontinence: Secondary | ICD-10-CM | POA: Diagnosis not present

## 2018-07-03 DIAGNOSIS — Z719 Counseling, unspecified: Secondary | ICD-10-CM | POA: Diagnosis not present

## 2018-07-09 DIAGNOSIS — Z23 Encounter for immunization: Secondary | ICD-10-CM | POA: Diagnosis not present

## 2018-07-10 DIAGNOSIS — Z719 Counseling, unspecified: Secondary | ICD-10-CM | POA: Diagnosis not present

## 2018-07-15 DIAGNOSIS — G4733 Obstructive sleep apnea (adult) (pediatric): Secondary | ICD-10-CM | POA: Diagnosis not present

## 2018-07-22 DIAGNOSIS — G4733 Obstructive sleep apnea (adult) (pediatric): Secondary | ICD-10-CM | POA: Diagnosis not present

## 2018-07-22 DIAGNOSIS — S82309A Unspecified fracture of lower end of unspecified tibia, initial encounter for closed fracture: Secondary | ICD-10-CM | POA: Diagnosis not present

## 2018-07-22 DIAGNOSIS — G5600 Carpal tunnel syndrome, unspecified upper limb: Secondary | ICD-10-CM | POA: Diagnosis not present

## 2018-07-23 ENCOUNTER — Ambulatory Visit (INDEPENDENT_AMBULATORY_CARE_PROVIDER_SITE_OTHER): Payer: 59 | Admitting: Internal Medicine

## 2018-07-23 ENCOUNTER — Encounter: Payer: Self-pay | Admitting: Internal Medicine

## 2018-07-23 VITALS — BP 120/76 | HR 84 | Ht 68.5 in | Wt 243.0 lb

## 2018-07-23 DIAGNOSIS — E669 Obesity, unspecified: Secondary | ICD-10-CM

## 2018-07-23 DIAGNOSIS — E039 Hypothyroidism, unspecified: Secondary | ICD-10-CM

## 2018-07-23 DIAGNOSIS — E041 Nontoxic single thyroid nodule: Secondary | ICD-10-CM | POA: Diagnosis not present

## 2018-07-23 LAB — TSH: TSH: 0.49 u[IU]/mL (ref 0.35–4.50)

## 2018-07-23 LAB — T4, FREE: Free T4: 1.04 ng/dL (ref 0.60–1.60)

## 2018-07-23 NOTE — Progress Notes (Signed)
Patient ID: Natalie Camacho, female   DOB: November 18, 1965, 52 y.o.   MRN: [id number]    HPI  Natalie Camacho is a 52 y.o.-year-old female, returning for follow-up for uncontrolled hypothyroidism and thyroid cyst.  Last visit 6 months ago.  Pt. has been dx with goiter hypothyroidism in 1992,  >> on Levothyroxine 224 mcg >> now 200 mcg daily.  Takes Synthroid DAW:   - in am (5 am) - fasting - coffee + creamer 30 min later - at least 30 min from b'fast - no Ca, Fe, PPIs - + MVI at night -  off Biotin x1  Reviewed TFTs: 05/06/2018: TSH 0.57 Lab Results  Component Value Date   TSH 1.95 01/21/2018   TSH 1.19 11/13/2017   FREET4 0.91 01/21/2018   FREET4 0.97 11/13/2017  10/08/2017: TSH 0.22 -she was on biotin at the time 08/10/2017: TSH 0.85, free T4 1.03 07/09/2017: TSH 8.70, free T4 0.76 06/14/2017: TSH 5.51, free T4, 0.65 12/26/2016: TSH 3.08, free T4 0.8   Patient had a thyroid ultrasound 06/29/2017:  0.9 cm small cystic nodule in the right inferior lobe  Pt c/o: Weight gain, hair loss, dry skin, weight gain- gained 40 lbs before last visit. 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  Pt denies: - feeling nodules in neck - hoarseness - dysphagia - choking - SOB with lying down  She has + FH of thyroid disorders in: hypothyroidism in mother and sister. No FH of thyroid cancer. No h/o radiation tx to head or neck.  No recent contrast studies. No herbal supplements. No Biotin use. No recent steroids use.   Pt. also has a history of GERD, iron deficiency anemia, asthma (on Albuterol, no steroids needed), OSA -CPAP. She has back pain and gets nerve blocks every 3-4 months through the pain clinic.  ROS: Constitutional: no weight gain/no weight loss, no fatigue, no subjective hyperthermia, no subjective hypothermia Eyes: no blurry vision, no xerophthalmia ENT: no sore throat,+ see HPI Cardiovascular: no  CP/no SOB/no palpitations/no leg swelling Respiratory: no cough/no SOB/no wheezing Gastrointestinal: no N/no V/no D/no C/no acid reflux Musculoskeletal: no muscle aches/no joint aches Skin: no rashes, no hair loss Neurological: no tremors/no numbness/no tingling/no dizziness  I reviewed pt's medications, allergies, PMH, social hx, family hx, and changes were documented in the history of present illness. Otherwise, unchanged from my initial visit note. Otherwise, unchanged from my initial visit note. Started Provigil.  Past Medical History:  Diagnosis Date  . Anemia   . Anxiety   . Asthma   . Depression   . GERD (gastroesophageal reflux disease)   . Headache(784.0)   . Heart murmur   . Hypertension   . Hypothyroidism   . PONV (postoperative nausea and vomiting)   . Sleep apnea    Past Surgical History:  Procedure Laterality Date  . ABDOMINOPLASTY    . CARPAL TUNNEL RELEASE    . FRACTURE SURGERY     ORIF rt ankle, tendon sheath removed  . fundal     fundal plication  . HERNIA REPAIR  2001  . NOSE SURGERY    . RECTOCELE REPAIR     Social History   Socioeconomic History  . Marital status: Married    Spouse name: Not on file  . Number of children: 1  Social Needs  Occupational History  .  Retired  Tobacco Use  . Smoking status: Never Smoker  . Smokeless tobacco: Never Used  Substance and Sexual Activity  . Alcohol use:  Liquor 3 times a year, 1-2 drinks  . Drug use: No   Current Outpatient Medications on File Prior to Visit  Medication Sig Dispense Refill  . acetaminophen (TYLENOL) 500 MG tablet Take 1,000 mg by mouth every 6 (six) hours as needed for headache (pain).    Marland Kitchen albuterol (PROAIR HFA) 108 (90 Base) MCG/ACT inhaler Inhale 2 puffs into the lungs every 6 (six) hours as needed for wheezing or shortness of breath.    . Brexpiprazole (REXULTI) 1 MG TABS Take 1 mg by mouth daily.    Marland Kitchen buPROPion (WELLBUTRIN XL) 300 MG 24 hr tablet Take 300 mg by mouth daily.     . Cholecalciferol (VITAMIN D-3) 5000 units TABS Take 5,000 Units by mouth daily.    . DULoxetine (CYMBALTA) 60 MG capsule Take 120 mg by mouth daily.     . fluocinonide cream (LIDEX) 6.16 % Apply 1 application topically 2 (two) times daily as needed (rash).   2  . hydrochlorothiazide (HYDRODIURIL) 25 MG tablet Take 25 mg by mouth daily.    . hydroxypropyl methylcellulose / hypromellose (ISOPTO TEARS / GONIOVISC) 2.5 % ophthalmic solution Place 1 drop into the left eye 4 (four) times daily as needed for dry eyes. 15 mL 12  . ibuprofen (ADVIL,MOTRIN) 200 MG tablet Take 400-600 mg by mouth every 6 (six) hours as needed for headache (pain).    Marland Kitchen loratadine (CLARITIN) 10 MG tablet Take 10 mg by mouth daily as needed (pet allergies).    . Multiple Vitamin (MULTIVITAMIN WITH MINERALS) TABS tablet Take 1 tablet by mouth at bedtime.    Vladimir Faster Glycol-Propyl Glycol (SYSTANE OP) Place 1 drop into both eyes daily as needed (dry eyes/ irritation).    . potassium chloride SA (K-DUR,KLOR-CON) 20 MEQ tablet Take 20 mEq by mouth daily.    Marland Kitchen PRESCRIPTION MEDICATION Inhale into the lungs at bedtime. CPAP    . rOPINIRole (REQUIP) 3 MG tablet Take 3 mg by mouth at bedtime.      . simvastatin (ZOCOR) 20 MG tablet Take 20 mg by mouth at bedtime.      Marland Kitchen SYNTHROID 200 MCG tablet TAKE 1 TABLET BY MOUTH  DAILY BEFORE BREAKFAST 45 tablet 0  . topiramate (TOPAMAX) 100 MG tablet Take 200 mg by mouth daily.     . valACYclovir (VALTREX) 1000 MG tablet Take 1 tablet (1,000 mg total) by mouth 3 (three) times daily. 21 tablet 0  . zolpidem (AMBIEN) 10 MG tablet Take 10 mg by mouth at bedtime.       No current facility-administered medications on file prior to visit.    Allergies  Allergen Reactions  . Accolate [Zafirlukast] Hives  . Aspirin Other (See Comments)    wheezing  . Augmentin [Amoxicillin-Pot Clavulanate] Hives and Nausea And Vomiting  . Codeine Nausea And Vomiting  . Erythromycin Nausea And Vomiting  .  Lyrica [Pregabalin] Other (See Comments)    Suicidal thoughts   . Peanut-Containing Drug Products Other (See Comments)    Wheezing    Family History  Problem Relation Age of Onset  . Hypothyroidism Mother   . Diabetes Father   Also,  heart disease in grandmother  diabetes in sister Lung cancer in grandfather  PE: BP 120/76   Pulse 84   Ht 5' 8.5" (1.74 m) Comment: measured  Wt 243 lb (110.2 kg)   SpO2 98%   BMI 36.41 kg/m  Wt Readings from Last 3  Encounters:  07/23/18 243 lb (110.2 kg)  01/21/18 243 lb (110.2 kg)  10/09/17 244 lb 12.8 oz (111 kg)   Constitutional: overweight, in NAD Eyes: PERRLA, EOMI, no exophthalmos ENT: moist mucous membranes, no thyromegaly, no cervical lymphadenopathy Cardiovascular: RRR, No MRG Respiratory: CTA B Gastrointestinal: abdomen soft, NT, ND, BS+ Musculoskeletal: no deformities, strength intact in all 4 Skin: moist, warm, no rashes Neurological: no tremor with outstretched hands, DTR normal in all 4  ASSESSMENT: 1. Hypothyroidism due to Hashimoto's thyroiditis.  2.  Thyroid nodule  3.  Obesity  PLAN:  1. Patient with long-standing hypothyroidism, on levothyroxine therapy.  We also diagnosed Hashimoto's thyroiditis based on high TPO antibodies - latest thyroid labs reviewed with pt >> normal 6 months ago - she continues on LT4 200 mcg daily - pt feels good on this dose. - we discussed about taking the thyroid hormone every day, with water, >30 minutes before breakfast, separated by >4 hours from acid reflux medications, calcium, iron, multivitamins. Pt. is taking it correctly. - will check thyroid tests today: TSH and fT4 - If labs are abnormal, she will need to return for repeat TFTs in 1.5 months  2.  Thyroid nodule -The report of her 06/2017 thyroid ultrasound showed a small subcentimeter cyst.  She does not need further investigation for this especially since she does not have any neck compression symptoms, although she does  have OSA and also asthma.  3. Obesity - she lost 17 lbs (reduced portions) approximately a year ago but gained some back and weight is stable since then - discussed about improving diet at last visit  Needs 90 day supply of LT4.  Component     Latest Ref Rng & Units 07/23/2018  T4,Free(Direct)     0.60 - 1.60 ng/dL 1.04  TSH     0.35 - 4.50 uIU/mL 0.49   Normal TFTs.   [name], MD PhD [facility]

## 2018-07-23 NOTE — Patient Instructions (Signed)
Please continue Synthroid  200 mcg daily.  Take the thyroid hormone every day, with water, at least 30 minutes before breakfast, separated by at least 4 hours from: - acid reflux medications - calcium - iron - multivitamins  Please return in 6 months.

## 2018-07-24 MED ORDER — SYNTHROID 200 MCG PO TABS: ORAL_TABLET | ORAL | 3 refills | Status: DC

## 2018-07-29 ENCOUNTER — Telehealth: Payer: Self-pay

## 2018-07-29 NOTE — Telephone Encounter (Signed)
Notified patient of message from Dr. Gherghe, patient expressed understanding and agreement. No further questions.  

## 2018-07-29 NOTE — Telephone Encounter (Signed)
-----   Message from [name], MD sent at 07/24/2018  8:43 AM EDT ----- Lenna Sciara, can you please call pt: Thyroid tests are normal so I refilled a 90-day supply of her levothyroxine 200 mcg daily.

## 2018-07-31 DIAGNOSIS — G5772 Causalgia of left lower limb: Secondary | ICD-10-CM | POA: Diagnosis not present

## 2018-07-31 DIAGNOSIS — M549 Dorsalgia, unspecified: Secondary | ICD-10-CM | POA: Diagnosis not present

## 2018-07-31 DIAGNOSIS — G90521 Complex regional pain syndrome I of right lower limb: Secondary | ICD-10-CM | POA: Diagnosis not present

## 2018-08-09 DIAGNOSIS — R8271 Bacteriuria: Secondary | ICD-10-CM | POA: Diagnosis not present

## 2018-08-09 DIAGNOSIS — N3946 Mixed incontinence: Secondary | ICD-10-CM | POA: Diagnosis not present

## 2018-09-10 DIAGNOSIS — J00 Acute nasopharyngitis [common cold]: Secondary | ICD-10-CM | POA: Diagnosis not present

## 2018-09-27 DIAGNOSIS — N3946 Mixed incontinence: Secondary | ICD-10-CM | POA: Diagnosis not present

## 2018-09-27 DIAGNOSIS — R351 Nocturia: Secondary | ICD-10-CM | POA: Diagnosis not present

## 2018-09-30 DIAGNOSIS — M722 Plantar fascial fibromatosis: Secondary | ICD-10-CM | POA: Diagnosis not present

## 2018-09-30 DIAGNOSIS — M7732 Calcaneal spur, left foot: Secondary | ICD-10-CM | POA: Diagnosis not present

## 2018-10-07 DIAGNOSIS — M722 Plantar fascial fibromatosis: Secondary | ICD-10-CM | POA: Diagnosis not present

## 2018-10-07 DIAGNOSIS — G5791 Unspecified mononeuropathy of right lower limb: Secondary | ICD-10-CM | POA: Diagnosis not present

## 2018-10-14 DIAGNOSIS — Z1231 Encounter for screening mammogram for malignant neoplasm of breast: Secondary | ICD-10-CM | POA: Diagnosis not present

## 2018-10-14 DIAGNOSIS — M722 Plantar fascial fibromatosis: Secondary | ICD-10-CM | POA: Diagnosis not present

## 2018-10-14 DIAGNOSIS — M71572 Other bursitis, not elsewhere classified, left ankle and foot: Secondary | ICD-10-CM | POA: Diagnosis not present

## 2018-10-14 DIAGNOSIS — Z6838 Body mass index (BMI) 38.0-38.9, adult: Secondary | ICD-10-CM | POA: Diagnosis not present

## 2018-10-14 DIAGNOSIS — Z01419 Encounter for gynecological examination (general) (routine) without abnormal findings: Secondary | ICD-10-CM | POA: Diagnosis not present

## 2018-10-16 DIAGNOSIS — G894 Chronic pain syndrome: Secondary | ICD-10-CM | POA: Diagnosis not present

## 2018-10-16 DIAGNOSIS — M549 Dorsalgia, unspecified: Secondary | ICD-10-CM | POA: Diagnosis not present

## 2018-10-16 DIAGNOSIS — M79604 Pain in right leg: Secondary | ICD-10-CM | POA: Diagnosis not present

## 2018-10-17 DIAGNOSIS — M5412 Radiculopathy, cervical region: Secondary | ICD-10-CM | POA: Diagnosis not present

## 2018-10-17 DIAGNOSIS — R634 Abnormal weight loss: Secondary | ICD-10-CM | POA: Diagnosis not present

## 2018-10-17 DIAGNOSIS — M5417 Radiculopathy, lumbosacral region: Secondary | ICD-10-CM | POA: Diagnosis not present

## 2018-10-17 DIAGNOSIS — G3184 Mild cognitive impairment, so stated: Secondary | ICD-10-CM | POA: Diagnosis not present

## 2018-10-17 DIAGNOSIS — G43909 Migraine, unspecified, not intractable, without status migrainosus: Secondary | ICD-10-CM | POA: Diagnosis not present

## 2018-10-17 DIAGNOSIS — G603 Idiopathic progressive neuropathy: Secondary | ICD-10-CM | POA: Diagnosis not present

## 2018-10-17 DIAGNOSIS — R202 Paresthesia of skin: Secondary | ICD-10-CM | POA: Diagnosis not present

## 2018-10-17 DIAGNOSIS — Z79899 Other long term (current) drug therapy: Secondary | ICD-10-CM | POA: Diagnosis not present

## 2018-10-23 ENCOUNTER — Other Ambulatory Visit: Payer: Self-pay | Admitting: Specialist

## 2018-10-23 DIAGNOSIS — G4733 Obstructive sleep apnea (adult) (pediatric): Secondary | ICD-10-CM | POA: Diagnosis not present

## 2018-10-23 DIAGNOSIS — R4701 Aphasia: Secondary | ICD-10-CM

## 2018-10-23 DIAGNOSIS — G3184 Mild cognitive impairment, so stated: Secondary | ICD-10-CM

## 2018-10-23 DIAGNOSIS — R488 Other symbolic dysfunctions: Secondary | ICD-10-CM

## 2018-10-24 DIAGNOSIS — S82309A Unspecified fracture of lower end of unspecified tibia, initial encounter for closed fracture: Secondary | ICD-10-CM | POA: Diagnosis not present

## 2018-10-24 DIAGNOSIS — G4733 Obstructive sleep apnea (adult) (pediatric): Secondary | ICD-10-CM | POA: Diagnosis not present

## 2018-10-24 DIAGNOSIS — G5600 Carpal tunnel syndrome, unspecified upper limb: Secondary | ICD-10-CM | POA: Diagnosis not present

## 2018-10-31 ENCOUNTER — Ambulatory Visit
Admission: RE | Admit: 2018-10-31 | Discharge: 2018-10-31 | Disposition: A | Discharge status: Home / Self Care | Payer: 59 | Source: Ambulatory Visit | Attending: Specialist | Admitting: Specialist

## 2018-10-31 DIAGNOSIS — R488 Other symbolic dysfunctions: Secondary | ICD-10-CM

## 2018-10-31 DIAGNOSIS — R4701 Aphasia: Secondary | ICD-10-CM

## 2018-10-31 DIAGNOSIS — G3184 Mild cognitive impairment, so stated: Secondary | ICD-10-CM

## 2018-10-31 DIAGNOSIS — R410 Disorientation, unspecified: Secondary | ICD-10-CM | POA: Diagnosis not present

## 2018-10-31 DIAGNOSIS — R4789 Other speech disturbances: Secondary | ICD-10-CM | POA: Diagnosis not present

## 2018-11-05 DIAGNOSIS — M545 Low back pain: Secondary | ICD-10-CM | POA: Diagnosis not present

## 2018-11-05 DIAGNOSIS — G43909 Migraine, unspecified, not intractable, without status migrainosus: Secondary | ICD-10-CM | POA: Diagnosis not present

## 2018-11-05 DIAGNOSIS — G2581 Restless legs syndrome: Secondary | ICD-10-CM | POA: Diagnosis not present

## 2018-11-05 DIAGNOSIS — R488 Other symbolic dysfunctions: Secondary | ICD-10-CM | POA: Diagnosis not present

## 2018-11-07 DIAGNOSIS — G5772 Causalgia of left lower limb: Secondary | ICD-10-CM | POA: Diagnosis not present

## 2018-11-07 DIAGNOSIS — G894 Chronic pain syndrome: Secondary | ICD-10-CM | POA: Diagnosis not present

## 2018-11-07 DIAGNOSIS — G90521 Complex regional pain syndrome I of right lower limb: Secondary | ICD-10-CM | POA: Diagnosis not present

## 2018-11-14 DIAGNOSIS — J45909 Unspecified asthma, uncomplicated: Secondary | ICD-10-CM | POA: Diagnosis not present

## 2018-11-14 DIAGNOSIS — G4733 Obstructive sleep apnea (adult) (pediatric): Secondary | ICD-10-CM | POA: Diagnosis not present

## 2018-11-14 DIAGNOSIS — G90521 Complex regional pain syndrome I of right lower limb: Secondary | ICD-10-CM | POA: Diagnosis not present

## 2018-11-21 DIAGNOSIS — J45909 Unspecified asthma, uncomplicated: Secondary | ICD-10-CM | POA: Diagnosis not present

## 2018-11-21 DIAGNOSIS — G90521 Complex regional pain syndrome I of right lower limb: Secondary | ICD-10-CM | POA: Diagnosis not present

## 2018-11-21 DIAGNOSIS — E859 Amyloidosis, unspecified: Secondary | ICD-10-CM | POA: Diagnosis not present

## 2018-12-12 DIAGNOSIS — G2581 Restless legs syndrome: Secondary | ICD-10-CM | POA: Diagnosis not present

## 2018-12-12 DIAGNOSIS — M5417 Radiculopathy, lumbosacral region: Secondary | ICD-10-CM | POA: Diagnosis not present

## 2018-12-12 DIAGNOSIS — G3184 Mild cognitive impairment, so stated: Secondary | ICD-10-CM | POA: Diagnosis not present

## 2018-12-12 DIAGNOSIS — G43909 Migraine, unspecified, not intractable, without status migrainosus: Secondary | ICD-10-CM | POA: Diagnosis not present

## 2018-12-15 IMAGING — MG 2D DIGITAL DIAGNOSTIC UNILATERAL RIGHT MAMMOGRAM WITH CAD AND AD
6 of 9 series · 6 of 21 positions shown · non-contrast
Comparison: Previous exam(s).

CLINICAL DATA: 51-year-old patient recently noticed palpable skin
thickening/induration involving the areola of the right breast, 4
o'clock region. She noticed this while in the shower. There has been
no associated pain, erythema, skin excoriation, bleeding, or pain.
She denies any prior episodes of similar skin thickening.

EXAM:
2D DIGITAL DIAGNOSTIC RIGHT MAMMOGRAM WITH CAD AND ADJUNCT TOMO
ULTRASOUND RIGHT BREAST

[R CC synth-2D]
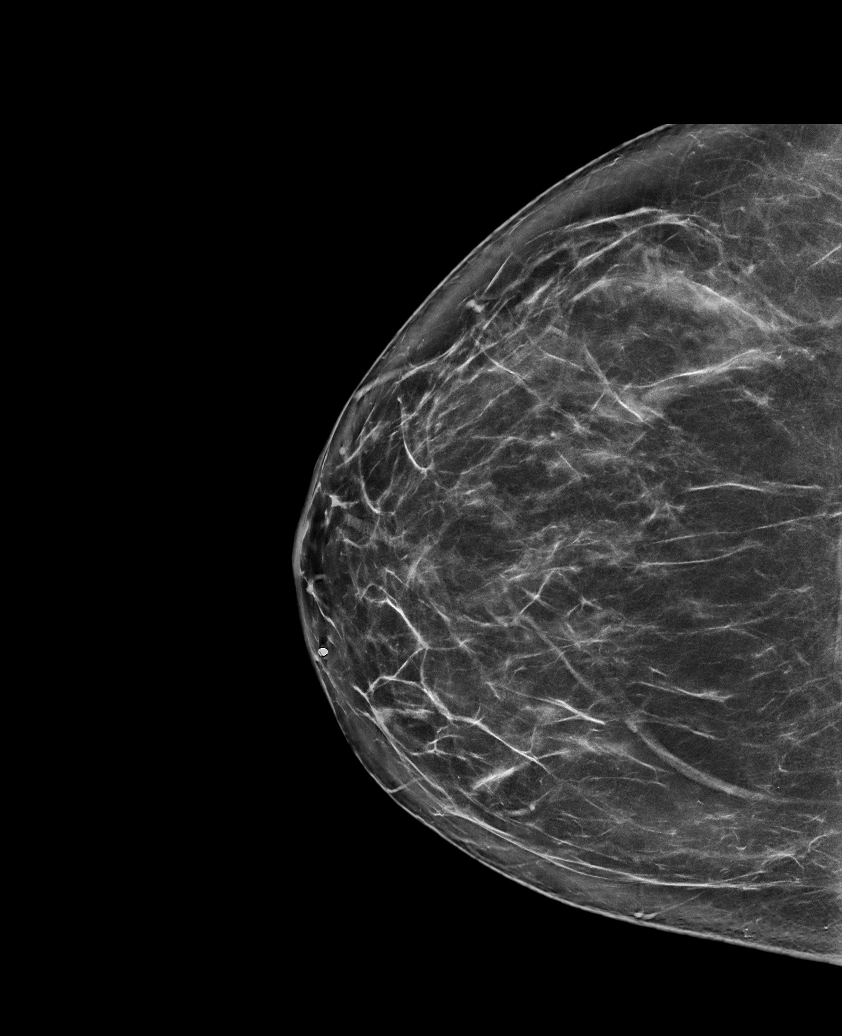

[R MLO (1 of 2)]
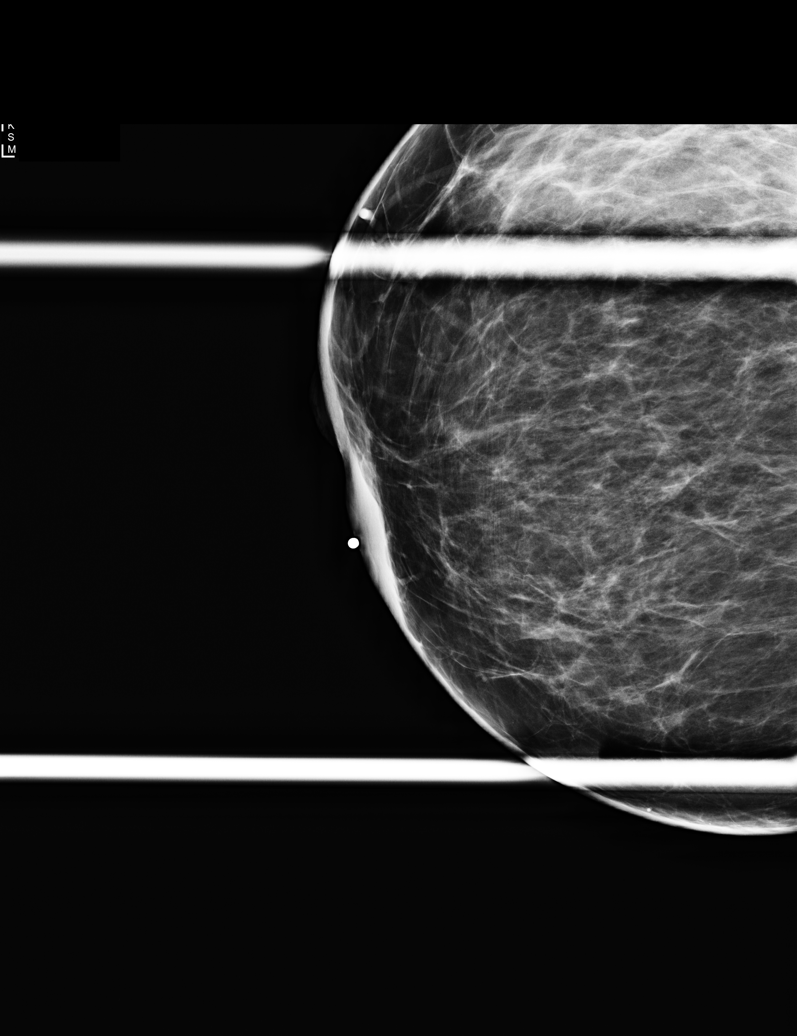

[R MLO (2 of 2)]
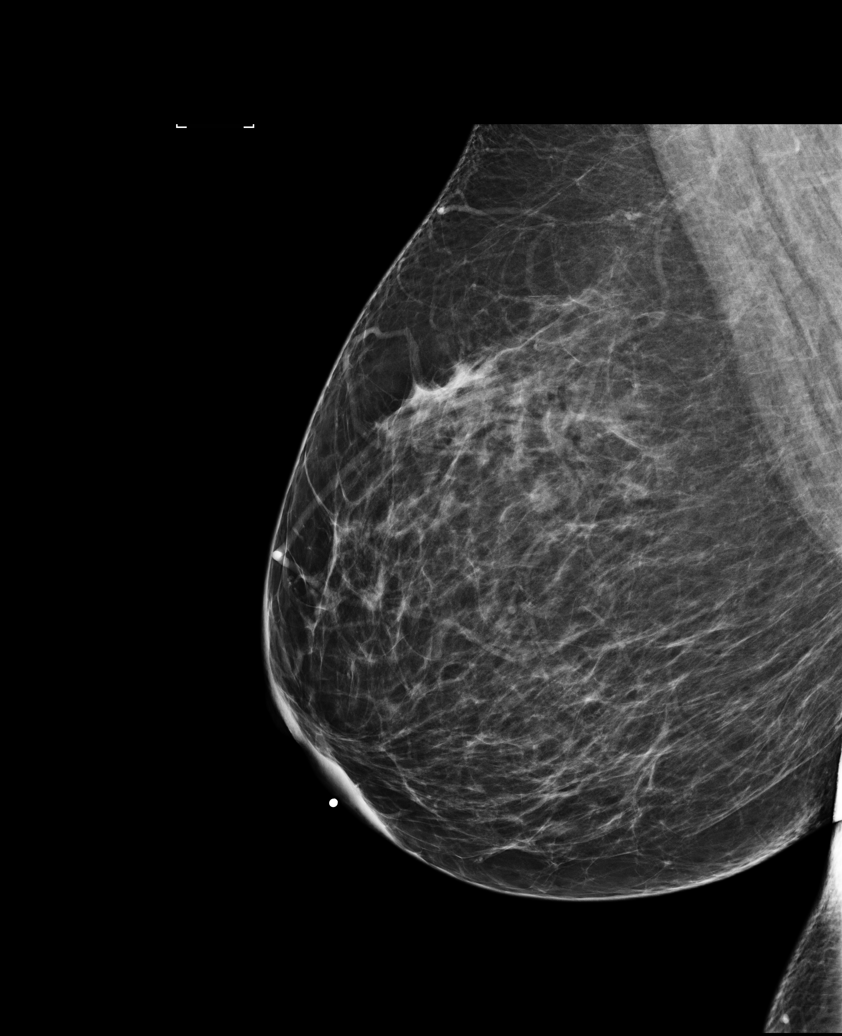

[R CC]
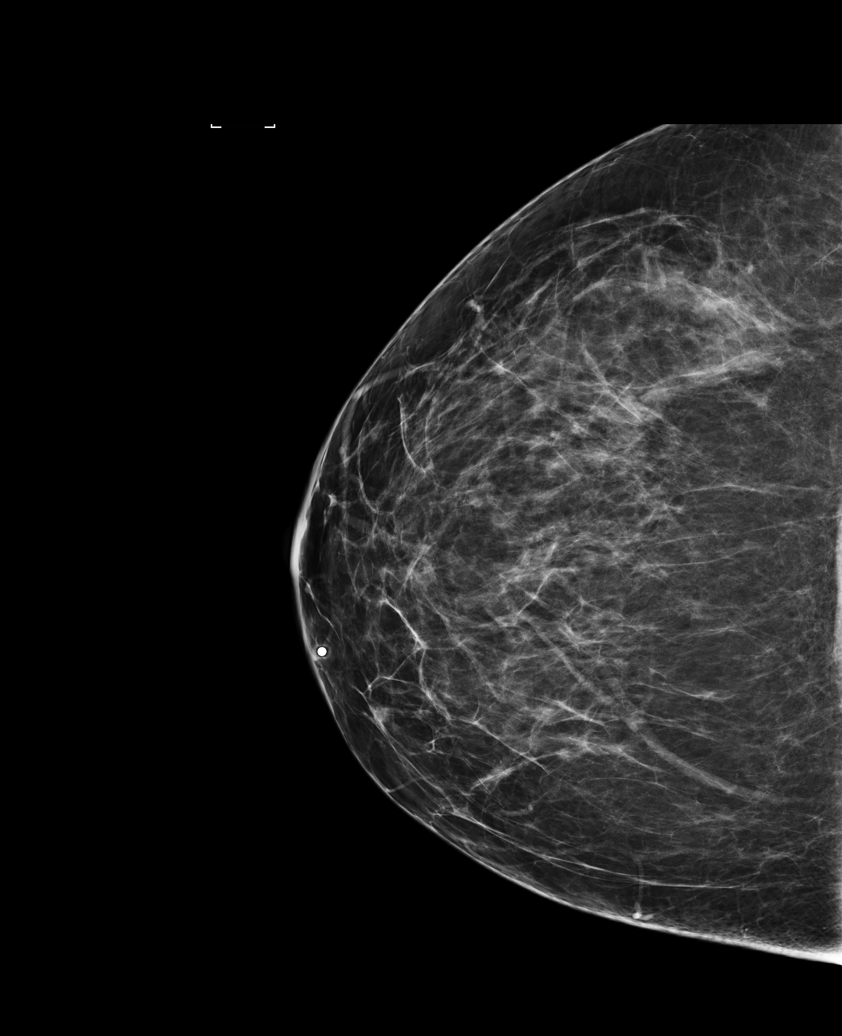

[R MLO synth-2D (1 of 2)]
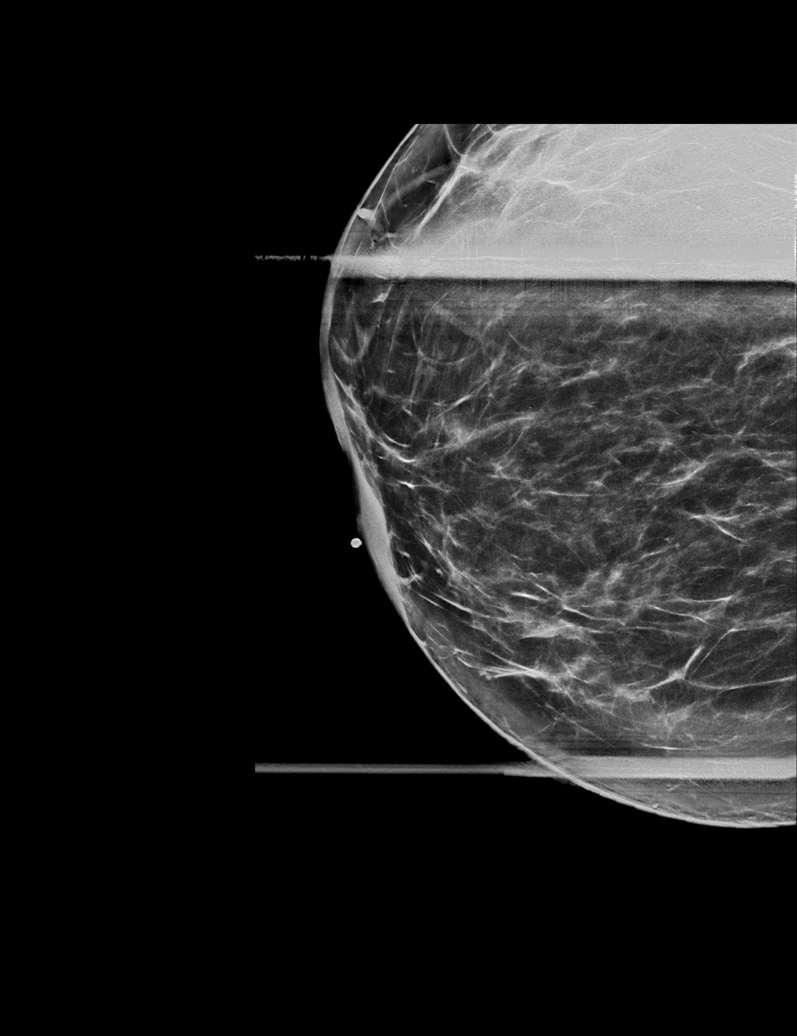

[R MLO synth-2D (2 of 2)]
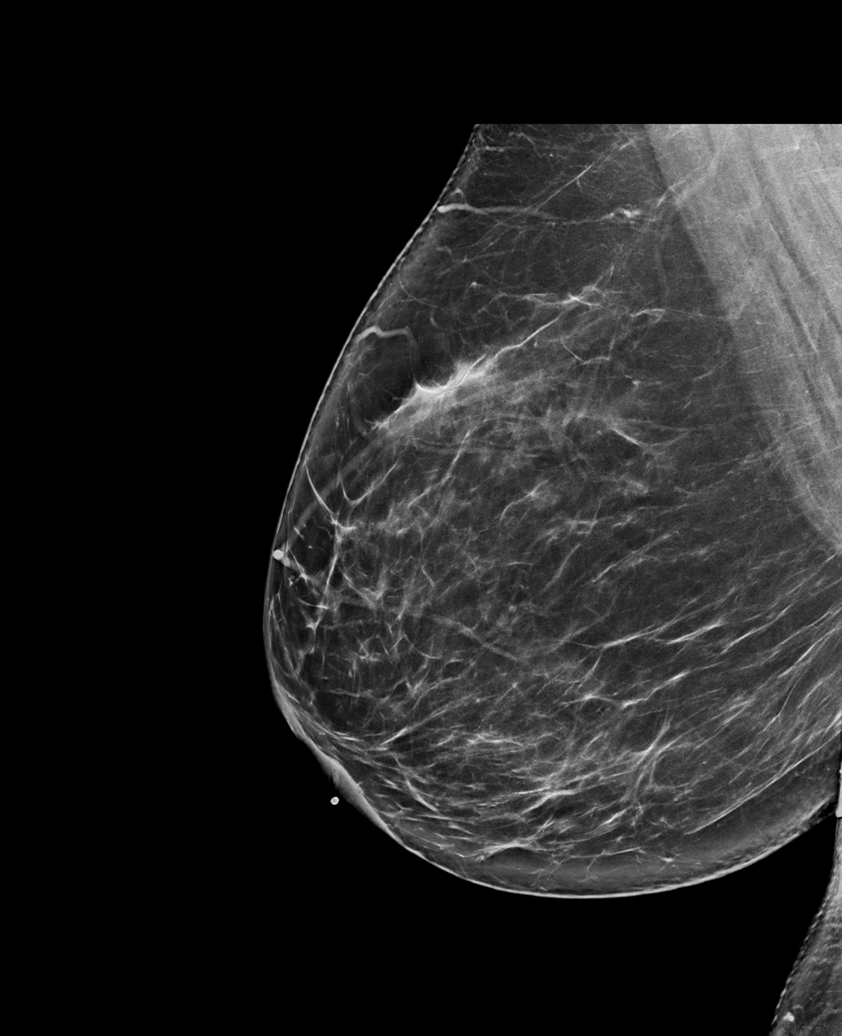

[6 of 21 positions shown; findings below may reference images not displayed]

ACR Breast Density Category b: There are scattered areas of
fibroglandular density.
FINDINGS: Metallic skin marker was placed on the right breast in region of
patient concern. On the MLO view, focal skin thickening is
appreciated in the inferior aspect of the areola. The subtending
breast parenchyma appears normal. No breast mass, distortion, edema,
or suspicious microcalcification.

No breast masses palpated.

Mammographic images were processed with CAD.

On physical exam, there is palpable induration/thickening of the
skin of the right areola in the 4 o'clock region. The skin appears
normal in color. The patient is not tender to palpation. The skin is
intact.

Targeted ultrasound is performed, showing skin thickening measuring
up to 6 mm in the 4 o'clock right breast areola. The region of skin
thickening spans at least 3.8 cm. On color Doppler imaging, there is
hyperemia in the thickened skin. No fluid collection is seen within
the dermis. The subjacent breast parenchyma is normal. No solid or
cystic mass or edema is identified.
IMPRESSION: Focal skin thickening/induration of the areola of the right breast
in the 4 o'clock region, of uncertain etiology. On clinical exam and
imaging, findings are not felt to be due to infection. The
underlying breast parenchyma appears normal.

RECOMMENDATION:
Surgical consultation for the consideration of skin punch biopsy is
suggested. Alternatively, the patient could be seen in short-term
follow-up at the [REDACTED]. The patient prefers surgical
consultation. This has been scheduled for the patient on December 25, 2016 with Dr. Hak Gu at [REDACTED] at [DATE].

I have discussed the findings and recommendations with the patient.
Results were also provided in writing at the conclusion of the
visit. If applicable, a reminder letter will be sent to the patient
regarding the next appointment.

BI-RADS CATEGORY  3: Probably benign.

## 2019-01-08 DIAGNOSIS — M549 Dorsalgia, unspecified: Secondary | ICD-10-CM | POA: Diagnosis not present

## 2019-01-08 DIAGNOSIS — G90521 Complex regional pain syndrome I of right lower limb: Secondary | ICD-10-CM | POA: Diagnosis not present

## 2019-01-08 DIAGNOSIS — G894 Chronic pain syndrome: Secondary | ICD-10-CM | POA: Diagnosis not present

## 2019-01-16 DIAGNOSIS — Z79890 Hormone replacement therapy (postmenopausal): Secondary | ICD-10-CM | POA: Diagnosis not present

## 2019-01-22 DIAGNOSIS — G4733 Obstructive sleep apnea (adult) (pediatric): Secondary | ICD-10-CM | POA: Diagnosis not present

## 2019-01-23 DIAGNOSIS — G4733 Obstructive sleep apnea (adult) (pediatric): Secondary | ICD-10-CM | POA: Diagnosis not present

## 2019-01-24 ENCOUNTER — Ambulatory Visit (INDEPENDENT_AMBULATORY_CARE_PROVIDER_SITE_OTHER): Payer: 59 | Admitting: Internal Medicine

## 2019-01-24 ENCOUNTER — Encounter: Payer: Self-pay | Admitting: Internal Medicine

## 2019-01-24 ENCOUNTER — Other Ambulatory Visit: Payer: Self-pay

## 2019-01-24 DIAGNOSIS — E669 Obesity, unspecified: Secondary | ICD-10-CM

## 2019-01-24 DIAGNOSIS — E041 Nontoxic single thyroid nodule: Secondary | ICD-10-CM

## 2019-01-24 DIAGNOSIS — R0602 Shortness of breath: Secondary | ICD-10-CM | POA: Diagnosis not present

## 2019-01-24 DIAGNOSIS — J45901 Unspecified asthma with (acute) exacerbation: Secondary | ICD-10-CM | POA: Diagnosis not present

## 2019-01-24 DIAGNOSIS — E039 Hypothyroidism, unspecified: Secondary | ICD-10-CM | POA: Diagnosis not present

## 2019-01-24 NOTE — Patient Instructions (Signed)
Please continue Synthroid  200 mcg daily.  Take the thyroid hormone every day, with water, at least 30 minutes before breakfast, separated by at least 4 hours from: - acid reflux medications - calcium - iron - multivitamins  Please return in 1 year but for labs in 4-6 months.

## 2019-01-24 NOTE — Progress Notes (Signed)
Patient ID: Natalie Camacho, female   DOB: [birth year], 53 y.o.   MRN: [id number]   Patient location: Home My location: Office  Referring Provider: Jonathon Jordan, MD  I connected with the patient on 01/24/19 at  11:52 am  EDT by a video enabled telemedicine application and verified that I am speaking with the correct person.   I discussed the limitations of evaluation and management by telemedicine and the availability of in person appointments. The patient expressed understanding and agreed to proceed.   Details of the encounter are shown below.  HPI  Natalie Camacho is a 53 y.o.-year-old female, presenting for follow-up for uncontrolled hypothyroidism and thyroid cyst.  Last visit 6 months ago.  She has SOB, cough (no fever) >> will be tested for Covid 19 today.  Pt. has been dx with goiter hypothyroidism in 1992 >> on high doses of levothyroxine, now 200 mcg daily.  Pt is on Synthroid d.a.w.: - in am (5 AM) - fasting - at least 30 min from b'fast  - no Ca, Fe, PPIs - + Multivitamins at night - off Biotin  Reviewed her TFTs: Lab Results  Component Value Date   TSH 0.49 07/23/2018   TSH 1.95 01/21/2018   TSH 1.19 11/13/2017   FREET4 1.04 07/23/2018   FREET4 0.91 01/21/2018   FREET4 0.97 11/13/2017  05/06/2018: TSH 0.57 10/08/2017: TSH 0.22 -she was on biotin at the time 08/10/2017: TSH 0.85, free T4 1.03 07/09/2017: TSH 8.70, free T4 0.76 06/14/2017: TSH 5.51, free T4, 0.65 12/26/2016: TSH 3.08, free T4 0.8   Patient had a thyroid ultrasound 06/29/2017: 0.9 cm small cystic nodule in the right inferior lobe  Pt c/o: Weight gain, hair loss, dry skin, weight gain- gained 40 lbs before last visit. 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  She has has hot flushes >> was found to be menopausal >> started Estradiol and the dose was recently increased. Sxs better.  She has improved fatigue, resolved anxiety/depression, diarrhea.  Pt denies: - feeling nodules in neck -  hoarseness - dysphagia - choking - SOB with lying down She has OSA - on CPAP - recently adjusted as felt she was suffocating >> much better.  She has + FH of thyroid disorders in: hypothyroidism in mother and sister. No FH of thyroid cancer. No h/o radiation tx to head or neck.  No herbal supplements. No Biotin use. No recent steroids use.   Pt. also has a history of GERD, iron deficiency anemia, asthma (on Albuterol).  She has back pain and gets nerve blocks every 3-4 months through the pain clinic.  ROS: Constitutional: + weight gain/no weight loss, no fatigue, no subjective hyperthermia, no subjective hypothermia Eyes: no blurry vision, no xerophthalmia ENT: no sore throat, + see HPI Cardiovascular: no CP/+ SOB/no palpitations/no leg swelling Respiratory: + cough/+ SOB/but no wheezing Gastrointestinal: no N/no V/no D/no C/no acid reflux Musculoskeletal: no muscle aches/no joint aches Skin: no rashes, no hair loss Neurological: no tremors/no numbness/no tingling/no dizziness  I reviewed pt's medications, allergies, PMH, social hx, family hx, and changes were documented in the history of present illness. Otherwise, unchanged from my initial visit note.  Past Medical History:  Diagnosis Date  . Anemia   . Anxiety   . Asthma   . Depression   . GERD (gastroesophageal reflux disease)   . Headache(784.0)   . Heart murmur   . Hypertension   . Hypothyroidism   . PONV (postoperative nausea and vomiting)   .  Sleep apnea    Past Surgical History:  Procedure Laterality Date  . ABDOMINOPLASTY    . CARPAL TUNNEL RELEASE    . FRACTURE SURGERY     ORIF rt ankle, tendon sheath removed  . fundal     fundal plication  . HERNIA REPAIR  2001  . NOSE SURGERY    . RECTOCELE REPAIR     Social History   Socioeconomic History  . Marital status: Married    Spouse name: Not on file  . Number of children: 1  Social Needs  Occupational History  .  Retired  Tobacco Use  . Smoking  status: Never Smoker  . Smokeless tobacco: Never Used  Substance and Sexual Activity  . Alcohol use:  Liquor 3 times a year, 1-2 drinks  . Drug use: No   Current Outpatient Medications on File Prior to Visit  Medication Sig Dispense Refill  . acetaminophen (TYLENOL) 500 MG tablet Take 1,000 mg by mouth every 6 (six) hours as needed for headache (pain).    Marland Kitchen albuterol (PROAIR HFA) 108 (90 Base) MCG/ACT inhaler Inhale 2 puffs into the lungs every 6 (six) hours as needed for wheezing or shortness of breath.    . Brexpiprazole (REXULTI) 1 MG TABS Take 1 mg by mouth daily.    Marland Kitchen buPROPion (WELLBUTRIN XL) 300 MG 24 hr tablet Take 300 mg by mouth daily.    . Cholecalciferol (VITAMIN D-3) 5000 units TABS Take 5,000 Units by mouth daily.    . DULoxetine (CYMBALTA) 60 MG capsule Take 120 mg by mouth daily.     . fluocinonide cream (LIDEX) 0.25 % Apply 1 application topically 2 (two) times daily as needed (rash).   2  . hydrochlorothiazide (HYDRODIURIL) 25 MG tablet Take 25 mg by mouth daily.    . hydroxypropyl methylcellulose / hypromellose (ISOPTO TEARS / GONIOVISC) 2.5 % ophthalmic solution Place 1 drop into the left eye 4 (four) times daily as needed for dry eyes. 15 mL 12  . ibuprofen (ADVIL,MOTRIN) 200 MG tablet Take 400-600 mg by mouth every 6 (six) hours as needed for headache (pain).    Marland Kitchen loratadine (CLARITIN) 10 MG tablet Take 10 mg by mouth daily as needed (pet allergies).    . Multiple Vitamin (MULTIVITAMIN WITH MINERALS) TABS tablet Take 1 tablet by mouth at bedtime.    Vladimir Faster Glycol-Propyl Glycol (SYSTANE OP) Place 1 drop into both eyes daily as needed (dry eyes/ irritation).    . potassium chloride SA (K-DUR,KLOR-CON) 20 MEQ tablet Take 20 mEq by mouth daily.    Marland Kitchen PRESCRIPTION MEDICATION Inhale into the lungs at bedtime. CPAP    . rOPINIRole (REQUIP) 3 MG tablet Take 3 mg by mouth at bedtime.      . simvastatin (ZOCOR) 20 MG tablet Take 20 mg by mouth at bedtime.      Marland Kitchen SYNTHROID 200  MCG tablet Take 1 tablet daily before b'fast 90 tablet 3  . topiramate (TOPAMAX) 100 MG tablet Take 200 mg by mouth daily.     . valACYclovir (VALTREX) 1000 MG tablet Take 1 tablet (1,000 mg total) by mouth 3 (three) times daily. 21 tablet 0  . zolpidem (AMBIEN) 10 MG tablet Take 10 mg by mouth at bedtime.       No current facility-administered medications on file prior to visit.    Allergies  Allergen Reactions  . Accolate [Zafirlukast] Hives  . Aspirin Other (See Comments)    wheezing  . Augmentin [Amoxicillin-Pot Clavulanate] Hives  and Nausea And Vomiting  . Codeine Nausea And Vomiting  . Erythromycin Nausea And Vomiting  . Lyrica [Pregabalin] Other (See Comments)    Suicidal thoughts   . Peanut-Containing Drug Products Other (See Comments)    Wheezing    Family History  Problem Relation Age of Onset  . Hypothyroidism Mother   . Diabetes Father   Also,  heart disease in grandmother  diabetes in sister Lung cancer in grandfather  PE: There were no vitals taken for this visit. Wt Readings from Last 3 Encounters:  07/23/18 243 lb (110.2 kg)  01/21/18 243 lb (110.2 kg)  10/09/17 244 lb 12.8 oz (111 kg)   Constitutional:  in NAD  The physical exam was not performed (virtual visit).  ASSESSMENT: 1. Hypothyroidism due to Hashimoto's thyroiditis.  2.  Thyroid nodule  3.  Obesity  PLAN:  1. Patient with longstanding hypothyroidism, on levothyroxine therapy.  We also diagnosed Hashimoto's thyroiditis based on high TPO antibodies. - latest thyroid labs reviewed with pt >> normal 05/2018 - she continues on LT4 200 mcg daily - pt feels good on this dose. - we discussed about taking the thyroid hormone every day, with water, >30 minutes before breakfast, separated by >4 hours from acid reflux medications, calcium, iron, multivitamins. Pt. is taking it correctly. - will check thyroid tests in 4-6 mo - she has an appt with PCP in August >> will need to get the records - I  will see her back in a year  2.  Thyroid nodule -Latest thyroid ultrasound report from 06/2017 was reviewed and this showed a small subcentimeter cyst (0.9 cm). -Since she does not have any neck compression symptoms, no further investigation is needed for this. -she recently had SOB at lying down >> resolved after CPAP setting adjusted  3. Obesity -She lost weight in the past by reducing portions  -gained 9 lbs since last OV -she will start Prednisone for her URI >> discussed that this can also can cause wt gain >> needs to be careful with her diet  [name], MD PhD [facility]

## 2019-02-19 DIAGNOSIS — G4733 Obstructive sleep apnea (adult) (pediatric): Secondary | ICD-10-CM | POA: Diagnosis not present

## 2019-03-21 ENCOUNTER — Other Ambulatory Visit (HOSPITAL_COMMUNITY): Payer: Self-pay | Admitting: Family Medicine

## 2019-03-21 DIAGNOSIS — R0602 Shortness of breath: Secondary | ICD-10-CM

## 2019-03-25 ENCOUNTER — Other Ambulatory Visit: Payer: Self-pay

## 2019-03-25 ENCOUNTER — Ambulatory Visit (INDEPENDENT_AMBULATORY_CARE_PROVIDER_SITE_OTHER): Payer: 59 | Admitting: Emergency Medicine

## 2019-03-25 ENCOUNTER — Encounter: Payer: Self-pay | Admitting: Emergency Medicine

## 2019-03-25 VITALS — BP 128/74 | HR 78 | Temp 97.6°F | Ht 67.5 in | Wt 262.2 lb

## 2019-03-25 DIAGNOSIS — Z227 Latent tuberculosis: Secondary | ICD-10-CM

## 2019-03-25 DIAGNOSIS — E859 Amyloidosis, unspecified: Secondary | ICD-10-CM | POA: Diagnosis not present

## 2019-03-25 DIAGNOSIS — R0600 Dyspnea, unspecified: Secondary | ICD-10-CM | POA: Diagnosis not present

## 2019-03-25 DIAGNOSIS — R0602 Shortness of breath: Secondary | ICD-10-CM

## 2019-03-25 DIAGNOSIS — G4733 Obstructive sleep apnea (adult) (pediatric): Secondary | ICD-10-CM

## 2019-03-25 NOTE — Progress Notes (Signed)
Subjective:    Patient ID: Natalie Camacho, female    DOB: 12-Sep-1966, 53 y.o.   MRN: [id number]  HPI 53 year old never smoker with a history of obesity, amyloidosis with skin manifestations (bone marrow and hematology evaluation reassuring), allergic rhinitis, hypothyroidism, GERD, obstructive sleep apnea on CPAP (AutoSet 7-16).  She carries a history of asthma as a child with continued symptoms into adulthood.  No recent pulmonary function testing.  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  Referred today for progressive exertional dyspnea.  She describes difficulty both at rest and with exertion that is been bothersome over the last 4 to 5 months.  She has to stop to rest frequently.  She is unsure if she is able to shop since her outings have been limited due to [place] isolation practices.  She states that it is sometimes hard to breathe through her throat or get a deep breath in.  Of note she has gained 20 to 30 pounds over the last 6 months unintentionally.  She has albuterol and she uses it about twice a day but states that it does not really help her very much.  A TTE has been ordered for amyloidosis surveillance, not yet done.    Review of Systems  Past Medical History:  Diagnosis Date  . Anemia   . Anxiety   . Asthma   . Depression   . GERD (gastroesophageal reflux disease)   . Headache(784.0)   . Heart murmur   . Hypertension   . Hypothyroidism   . PONV (postoperative nausea and vomiting)   . Sleep apnea      Family History  Problem Relation Age of Onset  . Hypothyroidism Mother   . Diabetes Father      Social History   Socioeconomic History  . Marital status: Married    Spouse name: Not on file  . Number of children: Not on file  . Years of education: Not on file  . Highest education level: Not on file  Occupational History  . Not on file  Social Needs  . Financial resource strain: Not on file  . Food insecurity   Worry: Not on file    Inability: Not on file  . Transportation needs    Medical: Not on file    Non-medical: Not on file  Tobacco Use  . Smoking status: Never Smoker  . Smokeless tobacco: Never Used  Substance and Sexual Activity  . Alcohol use: No  . Drug use: No  . Sexual activity: Not on file  Lifestyle  . Physical activity    Days per week: Not on file    Minutes per session: Not on file  . Stress: Not on file  Relationships  . Social Herbalist on phone: Not on file    Gets together: Not on file    Attends religious service: Not on file    Active member of club or organization: Not on file    Attends meetings of clubs or organizations: Not on file    Relationship status: Not on file  . Intimate partner violence    Fear of current or ex partner: Not on file    Emotionally abused: Not on file    Physically abused: Not on file    Forced sexual activity: Not on file  Other Topics Concern  . Not on file  Social History Narrative  . 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 Accolate [Zafirlukast] Hives  . Aspirin Other (See Comments)    wheezing  . Augmentin [Amoxicillin-Pot Clavulanate] Hives and Nausea And Vomiting  . Codeine Nausea And Vomiting  . Erythromycin Nausea And Vomiting  . Lyrica [Pregabalin] Other (See Comments)    Suicidal thoughts   . Peanut-Containing Drug Products Other (See Comments)    Wheezing      Outpatient Medications Prior to Visit  Medication Sig Dispense Refill  . acetaminophen (TYLENOL) 500 MG tablet Take 1,000 mg by mouth every 6 (six) hours as needed for headache (pain).    Marland Kitchen albuterol (PROAIR HFA) 108 (90 Base) MCG/ACT inhaler Inhale 2 puffs into the lungs every 6 (six) hours as needed for wheezing or shortness of breath.    . Brexpiprazole (REXULTI) 1 MG TABS Take 1 mg by mouth daily.    Marland Kitchen buPROPion (WELLBUTRIN XL) 300 MG 24 hr  tablet Take 300 mg by mouth daily.    . Cholecalciferol (VITAMIN D-3) 5000 units TABS Take 5,000 Units by mouth daily.    . DULoxetine (CYMBALTA) 60 MG capsule Take 120 mg by mouth daily.     . hydrochlorothiazide (HYDRODIURIL) 25 MG tablet Take 25 mg by mouth daily.    Marland Kitchen ibuprofen (ADVIL,MOTRIN) 200 MG tablet Take 400-600 mg by mouth every 6 (six) hours as needed for headache (pain).    Marland Kitchen loratadine (CLARITIN) 10 MG tablet Take 10 mg by mouth daily as needed (pet allergies).    . modafinil (PROVIGIL) 200 MG tablet Take 200 mg by mouth 2 (two) times daily. 1 in the morning and  Half in the afternoon.    . Multiple Vitamin (MULTIVITAMIN WITH MINERALS) TABS tablet Take 1 tablet by mouth at bedtime.    Marland Kitchen NITROFURANTOIN PO Take 100 mg by mouth daily.    Vladimir Faster Glycol-Propyl Glycol (SYSTANE OP) Place 1 drop into both eyes daily as needed (dry eyes/ irritation).    . potassium chloride SA (K-DUR,KLOR-CON) 20 MEQ tablet Take 20 mEq by mouth daily.    Marland Kitchen PRESCRIPTION MEDICATION Inhale into the lungs at bedtime. CPAP    . rOPINIRole (REQUIP) 3 MG tablet Take 3 mg by mouth at bedtime.      . simvastatin (ZOCOR) 20 MG tablet Take 20 mg by mouth at bedtime.      Marland Kitchen SYNTHROID 200 MCG tablet Take 1 tablet daily before b'fast 90 tablet 3  . topiramate (TOPAMAX) 100 MG tablet Take 200 mg by mouth 2 (two) times daily.     . valACYclovir (VALTREX) 1000 MG tablet Take 1 tablet (1,000 mg total) by mouth 3 (three) times daily. 21 tablet 0  . zolpidem (AMBIEN) 10 MG tablet Take 10 mg by mouth at bedtime.      . Estradiol-Norethindrone Acet 0.5-0.1 MG tablet Take 1 tablet by mouth daily.    . fluocinonide cream (LIDEX) 5.62 % Apply 1 application topically 2 (two) times daily as needed (rash).   2  . hydroxypropyl methylcellulose / hypromellose (ISOPTO TEARS / GONIOVISC) 2.5 % ophthalmic solution Place 1 drop into the left eye 4 (four) times daily as needed for dry eyes. (Patient not taking: Reported on 03/25/2019) 15  mL 12   No facility-administered medications prior to visit.         Objective:   Physical Exam  Vitals:   03/25/19 1347  BP: 128/74  Pulse:  78  Temp: 97.6 F (36.4 C)  TempSrc: Oral  SpO2: 97%  Weight: 262 lb 3.2 oz (118.9 kg)  Height: 5' 7.5" (1.715 m)   Gen: Pleasant, obese woman, in no distress,  normal affect  ENT: No lesions, crowded posterior pharynx, mouth clear,  oropharynx clear, no postnasal drip  Neck: No JVD, mild inspiratory and expiratory stridor  Lungs: No use of accessory muscles, some referred upper airway noise with some expiratory squeaks especially on the left  Cardiovascular: RRR, heart sounds normal, no murmur or gallops, trace peripheral edema especially left lower extremity  Musculoskeletal: Some muscle wasting right lower extremity  Neuro: alert, awake, non focal  Skin: Warm, no lesions or rash    Assessment & Plan:  Dyspnea Her symptoms are with exertion but also at rest.  They sound somewhat restrictive in nature although she carries a longstanding history of asthma.  She has had some weight gain over the last 6 months which could be contributing.  Consider also amyloidosis which does potentially have either lung or cardiac manifestations that could cause dyspnea.  She is due for surveillance transthoracic echocardiogram, has not been done yet.  She has not significantly benefited from albuterol.  She needs a CT chest, pulmonary function testing to help determine whether asthma is a contributor, whether there may be some amyloidosis present in the lung.  If both are reassuring and if her echocardiogram is normal then we may be able to ascribe some of her symptoms to weight gain and deconditioning.  TB lung, latent Treated with an antibiotic regimen for 1 year.  Unclear to me whether there was any chest x-ray abnormality at the time of treatment.  Her most recent chest x-ray was clear.  We have a CT scan of the chest pending and will look for any  sequela.  Obstructive sleep apnea We discussed this, possibly changing her pressure range on her AutoSet.  For now she will continue 7-16 cm water.  Baltazar Apo, MD, PhD 03/25/2019, 5:48 PM [facility] [phone] or if no answer [phone]

## 2019-03-25 NOTE — Progress Notes (Signed)
   Subjective:    Patient ID: Natalie Camacho, female    DOB: 03/06/1966, 53 y.o.   MRN: [id number]  HPI    Review of Systems  Constitutional: Negative.  Negative for fever and unexpected weight change.  HENT: Positive for congestion and sinus pressure. Negative for dental problem, ear pain, nosebleeds, postnasal drip, rhinorrhea, sore throat and trouble swallowing.   Eyes: Negative for redness and itching.  Respiratory: Positive for cough, chest tightness, shortness of breath and wheezing.   Cardiovascular: Negative for palpitations and leg swelling.  Gastrointestinal: Negative.  Negative for nausea and vomiting.  Genitourinary: Negative.  Negative for dysuria.  Musculoskeletal: Negative.  Negative for joint swelling.  Skin: Negative.  Negative for rash.  Allergic/Immunologic: Positive for environmental allergies. Negative for food allergies and immunocompromised state.  Neurological: Negative for headaches.  Hematological: Negative.  Does not bruise/bleed easily.  Psychiatric/Behavioral: Negative.  Negative for dysphoric mood. The patient is not nervous/anxious.        Objective:   Physical Exam        Assessment & Plan:

## 2019-03-25 NOTE — Assessment & Plan Note (Signed)
We discussed this, possibly changing her pressure range on her AutoSet.  For now she will continue 7-16 cm water.

## 2019-03-25 NOTE — Patient Instructions (Signed)
We will perform full pulmonary function testing We will perform a high-resolution CT scan of the chest without contrast Agree with getting your echocardiogram when you are able to schedule this. Continue your CPAP on its current AutoSet range of 7-16 cmH2O Keep your albuterol available to use 2 puffs up to every 4 hours if needed for shortness of breath, chest tightness, wheezing. Follow with Dr. Lamonte Sakai next available after your testing so that we can review the results together.

## 2019-03-25 NOTE — Assessment & Plan Note (Signed)
Her symptoms are with exertion but also at rest.  They sound somewhat restrictive in nature although she carries a longstanding history of asthma.  She has had some weight gain over the last 6 months which could be contributing.  Consider also amyloidosis which does potentially have either lung or cardiac manifestations that could cause dyspnea.  She is due for surveillance transthoracic echocardiogram, has not been done yet.  She has not significantly benefited from albuterol.  She needs a CT chest, pulmonary function testing to help determine whether asthma is a contributor, whether there may be some amyloidosis present in the lung.  If both are reassuring and if her echocardiogram is normal then we may be able to ascribe some of her symptoms to weight gain and deconditioning.

## 2019-03-25 NOTE — Assessment & Plan Note (Signed)
Treated with an antibiotic regimen for 1 year.  Unclear to me whether there was any chest x-ray abnormality at the time of treatment.  Her most recent chest x-ray was clear.  We have a CT scan of the chest pending and will look for any sequela.

## 2019-04-10 ENCOUNTER — Inpatient Hospital Stay: Admission: RE | Admit: 2019-04-10 | Payer: 59 | Source: Ambulatory Visit

## 2019-04-28 ENCOUNTER — Telehealth (HOSPITAL_COMMUNITY): Payer: Self-pay | Admitting: Radiology

## 2019-04-28 NOTE — Telephone Encounter (Signed)
Left message to call office-Patient needs to schedule a stress echocardiogram. Please do not schedule. Please transfer patient to J[facility] Sportsman to schedule.

## 2019-05-05 ENCOUNTER — Other Ambulatory Visit: Payer: Self-pay

## 2019-05-05 ENCOUNTER — Ambulatory Visit (INDEPENDENT_AMBULATORY_CARE_PROVIDER_SITE_OTHER)
Admission: RE | Admit: 2019-05-05 | Discharge: 2019-05-05 | Disposition: A | Discharge status: Home / Self Care | Payer: 59 | Source: Ambulatory Visit | Attending: Emergency Medicine | Admitting: Emergency Medicine

## 2019-05-05 DIAGNOSIS — E859 Amyloidosis, unspecified: Secondary | ICD-10-CM | POA: Diagnosis not present

## 2019-05-06 ENCOUNTER — Other Ambulatory Visit: Payer: Self-pay | Admitting: Emergency Medicine

## 2019-05-15 ENCOUNTER — Other Ambulatory Visit (HOSPITAL_COMMUNITY)
Admission: RE | Admit: 2019-05-15 | Discharge: 2019-05-15 | Disposition: A | Discharge status: Home / Self Care | Payer: 59 | Source: Ambulatory Visit | Attending: Emergency Medicine | Admitting: Emergency Medicine

## 2019-05-15 DIAGNOSIS — Z20828 Contact with and (suspected) exposure to other viral communicable diseases: Secondary | ICD-10-CM | POA: Insufficient documentation

## 2019-05-15 DIAGNOSIS — Z01812 Encounter for preprocedural laboratory examination: Secondary | ICD-10-CM | POA: Diagnosis present

## 2019-05-15 LAB — SARS CORONAVIRUS 2 (TAT 6-24 HRS): SARS Coronavirus 2: NEGATIVE

## 2019-05-19 ENCOUNTER — Telehealth: Payer: Self-pay | Admitting: Internal Medicine

## 2019-05-19 ENCOUNTER — Ambulatory Visit (INDEPENDENT_AMBULATORY_CARE_PROVIDER_SITE_OTHER): Payer: 59 | Admitting: Emergency Medicine

## 2019-05-19 ENCOUNTER — Other Ambulatory Visit: Payer: Self-pay

## 2019-05-19 ENCOUNTER — Encounter: Payer: Self-pay | Admitting: Emergency Medicine

## 2019-05-19 DIAGNOSIS — J4530 Mild persistent asthma, uncomplicated: Secondary | ICD-10-CM

## 2019-05-19 DIAGNOSIS — E859 Amyloidosis, unspecified: Secondary | ICD-10-CM | POA: Diagnosis not present

## 2019-05-19 DIAGNOSIS — J45909 Unspecified asthma, uncomplicated: Secondary | ICD-10-CM | POA: Insufficient documentation

## 2019-05-19 DIAGNOSIS — R0600 Dyspnea, unspecified: Secondary | ICD-10-CM | POA: Diagnosis not present

## 2019-05-19 DIAGNOSIS — G4733 Obstructive sleep apnea (adult) (pediatric): Secondary | ICD-10-CM | POA: Diagnosis not present

## 2019-05-19 LAB — PULMONARY FUNCTION TEST
DL/VA % pred: 115 %
DL/VA: 4.82 ml/min/mmHg/L
DLCO unc % pred: 89 %
DLCO unc: 21.22 ml/min/mmHg
FEF 25-75 Post: 2.27 L/sec
FEF 25-75 Pre: 2.19 L/sec
FEF2575-%Change-Post: 3 %
FEF2575-%Pred-Post: 77 %
FEF2575-%Pred-Pre: 74 %
FEV1-%Change-Post: 8 %
FEV1-%Pred-Post: 73 %
FEV1-%Pred-Pre: 67 %
FEV1-Post: 2.31 L
FEV1-Pre: 2.14 L
FEV1FVC-%Change-Post: -3 %
FEV1FVC-%Pred-Pre: 101 %
FEV6-%Change-Post: 11 %
FEV6-%Pred-Post: 76 %
FEV6-%Pred-Pre: 68 %
FEV6-Post: 2.96 L
FEV6-Pre: 2.65 L
FEV6FVC-%Pred-Post: 102 %
FEV6FVC-%Pred-Pre: 102 %
FVC-%Change-Post: 11 %
FVC-%Pred-Post: 73 %
FVC-%Pred-Pre: 66 %
FVC-Post: 2.96 L
FVC-Pre: 2.65 L
Post FEV1/FVC ratio: 78 %
Post FEV6/FVC ratio: 100 %
Pre FEV1/FVC ratio: 81 %
Pre FEV6/FVC Ratio: 100 %
RV % pred: 105 %
RV: 2.1 L
TLC % pred: 88 %
TLC: 4.96 L

## 2019-05-19 MED ORDER — BUDESONIDE-FORMOTEROL FUMARATE 160-4.5 MCG/ACT IN AERO: 2.0000 | INHALATION_SPRAY | Freq: Two times a day (BID) | RESPIRATORY_TRACT | 6 refills | Status: DC

## 2019-05-19 NOTE — Assessment & Plan Note (Signed)
Agree with adjusting your AutoSet range on your CPAP.  Please discuss this with your CPAP physician.

## 2019-05-19 NOTE — Telephone Encounter (Signed)
Patient called re: Patient had her labs at her Physical with her PCP. Patient's TSH is over 11. Patient's PCP sent results to Dr. Cruzita Lederer. Patient requests to be called at ph# [phone] to advise.

## 2019-05-19 NOTE — Progress Notes (Signed)
Full PFT performed today. °

## 2019-05-19 NOTE — Telephone Encounter (Signed)
I did not get these yet >>> can you check on this? However, on the same LT4 dose (200 mcg daily) her tests were normal in the past. Did she miss any doses or changed how she takes it? Any supplements close to it? I would like to repeat these tests before changing the dose if no apparent reason is evident (TSH and fT4 in 5 weeks).

## 2019-05-19 NOTE — Progress Notes (Incomplete)
Subjective:    Patient ID: Natalie Camacho, female    DOB: 06-Jan-1966, 53 y.o.   MRN: [id number]  HPI 53 year old never smoker with a history of obesity, amyloidosis with skin manifestations (bone marrow and hematology evaluation reassuring), allergic rhinitis, hypothyroidism, GERD, obstructive sleep apnea on CPAP (AutoSet 7-16).  She carries a history of asthma as a child with continued symptoms into adulthood.  No recent pulmonary function testing.  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  Referred today for progressive exertional dyspnea.  She describes difficulty both at rest and with exertion that is been bothersome over the last 4 to 5 months.  She has to stop to rest frequently.  She is unsure if she is able to shop since her outings have been limited due to [place] isolation practices.  She states that it is sometimes hard to breathe through her throat or get a deep breath in.  Of note she has gained 20 to 30 pounds over the last 6 months unintentionally.  She has albuterol and she uses it about twice a day but states that it does not really help her very much.  A TTE has been ordered for amyloidosis surveillance, not yet done.   ROV 05/19/2019 --follow-up visit 53 year old woman with obesity, amyloidosis, allergic rhinitis, GERD, OSA on auto CPAP.  Also with a history of latent TB (treated).  Returns for evaluation of progressive exertional dyspnea.  Compounding factor has been about 30 pounds of unintentional weight loss.  She underwent pulmonary function testing today which I reviewed, shows mixed obstruction and restriction on spirometry with a borderline bronchodilator response, normal lung volumes (question pseudo-normalized), normal diffusion capacity.  High-resolution CT scan of the chest done on 05/05/2019 was reviewed, shows some medial left lung basilar scarring but no evidence of ILD.  There may be some mild segmental air trapping in his left base  associated with the scarring. TTE still hasn't been done. She notes that has been stable since last time   Review of Systems  Past Medical History:  Diagnosis Date   Anemia    Anxiety    Asthma    Depression    GERD (gastroesophageal reflux disease)    Headache(784.0)    Heart murmur    Hypertension    Hypothyroidism    PONV (postoperative nausea and vomiting)    Sleep apnea      Family History  Problem Relation Age of Onset   Hypothyroidism Mother    Diabetes Father      Social History   Socioeconomic History   Marital status: Married    Spouse name: Not on file   Number of children: Not on file   Years of education: Not on file   Highest education level: Not on file  Occupational History   Not on file  Social Needs   Financial resource strain: Not on file   Food insecurity    Worry: Not on file    Inability: Not on file   Transportation needs    Medical: Not on file    Non-medical: Not on file  Tobacco Use   Smoking status: Never Smoker   Smokeless tobacco: Never Used  Substance and Sexual Activity   Alcohol use: No   Drug use: No   Sexual activity: Not on file  Lifestyle   Physical activity    Days per week: Not on file    Minutes per session: Not on  file   Stress: Not on file  Relationships   Social connections    Talks on phone: Not on file    Gets together: Not on file    Attends religious service: Not on file    Active member of club or organization: Not on file    Attends meetings of clubs or organizations: Not on file    Relationship status: Not on file   Intimate partner violence    Fear of current or ex partner: Not on file    Emotionally abused: Not on file    Physically abused: Not on file    Forced sexual activity: Not on file  Other Topics Concern   Not on file  Social History Narrative   Not on file   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     albuterol (PROAIR HFA) 108 (90 Base) MCG/ACT inhaler Inhale 2 puffs into the lungs every 6 (six) hours as needed for wheezing or shortness of breath.     Brexpiprazole (REXULTI) 1 MG TABS Take 1 mg by mouth daily.     buPROPion (WELLBUTRIN XL) 300 MG 24 hr tablet Take 300 mg by mouth daily.     Cholecalciferol (VITAMIN D-3) 5000 units TABS Take 5,000 Units by mouth daily.     DULoxetine (CYMBALTA) 60 MG capsule Take 120 mg by mouth daily.      Estradiol-Norethindrone Acet 0.5-0.1 MG tablet Take 1 tablet by mouth daily.     fluocinonide cream (LIDEX) 4.66 % Apply 1 application topically 2 (two) times daily as needed (rash).   2   hydrochlorothiazide (HYDRODIURIL) 25 MG tablet Take 25 mg by mouth daily.     hydroxypropyl methylcellulose / hypromellose (ISOPTO TEARS / GONIOVISC) 2.5 % ophthalmic solution Place 1 drop into the left eye 4 (four) times daily as needed for dry eyes. 15 mL 12   ibuprofen (ADVIL,MOTRIN) 200 MG tablet Take 400-600 mg by mouth every 6 (six) hours as needed for headache (pain).     loratadine (CLARITIN) 10 MG tablet Take 10 mg by mouth daily as needed (pet allergies).     modafinil (PROVIGIL) 200 MG tablet Take 200 mg by mouth 2 (two) times daily. 1 in the morning and  Half in the afternoon.     Multiple Vitamin (MULTIVITAMIN WITH MINERALS) TABS tablet Take 1 tablet by mouth at  bedtime.     NITROFURANTOIN PO Take 100 mg by mouth daily.     Polyethyl Glycol-Propyl Glycol (SYSTANE OP) Place 1 drop into both eyes daily as needed (dry eyes/ irritation).     potassium chloride SA (K-DUR,KLOR-CON) 20 MEQ tablet Take 20 mEq by mouth daily.     PRESCRIPTION MEDICATION Inhale into the lungs at bedtime. CPAP     rOPINIRole (REQUIP) 3 MG tablet Take 3 mg by mouth at bedtime.  simvastatin (ZOCOR) 20 MG tablet Take 20 mg by mouth at bedtime.       SYNTHROID 200 MCG tablet Take 1 tablet daily before b'fast 90 tablet 3   topiramate (TOPAMAX) 100 MG tablet Take 200 mg by mouth 2 (two) times daily.      valACYclovir (VALTREX) 1000 MG tablet Take 1 tablet (1,000 mg total) by mouth 3 (three) times daily. 21 tablet 0   zolpidem (AMBIEN) 10 MG tablet Take 10 mg by mouth at bedtime.       No facility-administered medications prior to visit.         Objective:   Physical Exam  Vitals:   05/19/19 1004  BP: 128/78  Pulse: 77  Temp: (!) 96.4 F (35.8 C)  TempSrc: Oral  SpO2: 97%  Weight: 262 lb (118.8 kg)  Height: 5' 7.8" (1.722 m)   Gen: Pleasant, obese woman, in no distress,  normal affect  ENT: No lesions, crowded posterior pharynx, mouth clear,  oropharynx clear, no postnasal drip  Neck: No JVD, mild inspiratory and expiratory stridor  Lungs: No use of accessory muscles, some referred upper airway noise with some expiratory squeaks especially on the left  Cardiovascular: RRR, heart sounds normal, no murmur or gallops, trace peripheral edema especially left lower extremity  Musculoskeletal: Some muscle wasting right lower extremity  Neuro: alert, awake, non focal  Skin: Warm, no lesions or rash    Assessment & Plan:  No problem-specific Assessment & Plan notes found for this encounter.  Baltazar Apo, MD, PhD 05/19/2019, 10:13 AM [facility] [phone] or if no answer [phone]

## 2019-05-19 NOTE — Patient Instructions (Signed)
We will try starting Symbicort, 2 puffs twice a day.  Were to rinse and gargle after using.  Try this for the next month.  If you get benefit then call us and we will continue with your pharmacy. Keep your albuterol available use 2 puffs if needed for shortness of breath, chest tightness, wheezing.  He may want to try taking 2 puffs before exercise to see if this helps your exercise tolerance. Your CT scan of the chest was reassuring without any evidence for amyloidosis. You still need to get your echocardiogram as planned. Agree with adjusting your AutoSet range on your CPAP.  Please discuss this with your CPAP physician. Follow with Dr Lamonte Sakai in 3 months or sooner if you have any problems.

## 2019-05-19 NOTE — Addendum Note (Signed)
Addended by: Jannette Spanner on: 05/19/2019 11:34 AM   Modules accepted: Orders

## 2019-05-19 NOTE — Assessment & Plan Note (Signed)
With principally skin manifestations.  Bone marrow negative.  I do not see any evidence of amyloid on her CT scan of the chest.  We reviewed this today.  Any restrictive disease on her pulmonary function testing likely can be ascribed to her weight gain.

## 2019-05-19 NOTE — Progress Notes (Signed)
Subjective:    Patient ID: Natalie Camacho, female    DOB: 06/19/1966, 53 y.o.   MRN: [id number]  HPI 53 year old never smoker with a history of obesity, amyloidosis with skin manifestations (bone marrow and hematology evaluation reassuring), allergic rhinitis, hypothyroidism, GERD, obstructive sleep apnea on CPAP (AutoSet 7-16).  She carries a history of asthma as a child with continued symptoms into adulthood.  No recent pulmonary function testing.  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  Referred today for progressive exertional dyspnea.  She describes difficulty both at rest and with exertion that is been bothersome over the last 4 to 5 months.  She has to stop to rest frequently.  She is unsure if she is able to shop since her outings have been limited due to [place] isolation practices.  She states that it is sometimes hard to breathe through her throat or get a deep breath in.  Of note she has gained 20 to 30 pounds over the last 6 months unintentionally.  She has albuterol and she uses it about twice a day but states that it does not really help her very much.  A TTE has been ordered for amyloidosis surveillance, not yet done.   ROV 05/19/2019 --follow-up visit 53 year old woman with obesity, amyloidosis, allergic rhinitis, GERD, OSA on auto CPAP.  Also with a history of latent TB (treated).  Returns for evaluation of progressive exertional dyspnea.  Compounding factor has been about 30 pounds of unintentional weight loss.  She underwent pulmonary function testing today which I reviewed, shows mixed obstruction and restriction on spirometry with a borderline bronchodilator response, normal lung volumes (question pseudo-normalized), normal diffusion capacity.  High-resolution CT scan of the chest done on 05/05/2019 was reviewed, shows some medial left lung basilar scarring but no evidence of ILD.  There may be some mild segmental air trapping in his left base  associated with the scarring. TTE still hasn't been done. Still w exertional SOB. Wt stable. She has been found to have persistent hypothyroidism even on meds, being titrated.   Review of Systems  Past Medical History:  Diagnosis Date  . Anemia   . Anxiety   . Asthma   . Depression   . GERD (gastroesophageal reflux disease)   . Headache(784.0)   . Heart murmur   . Hypertension   . Hypothyroidism   . PONV (postoperative nausea and vomiting)   . Sleep apnea      Family History  Problem Relation Age of Onset  . Hypothyroidism Mother   . Diabetes Father      Social History   Socioeconomic History  . Marital status: Married    Spouse name: Not on file  . Number of children: Not on file  . Years of education: Not on file  . Highest education level: Not on file  Occupational History  . Not on file  Social Needs  . Financial resource strain: Not on file  . Food insecurity    Worry: Not on file    Inability: Not on file  . Transportation needs    Medical: Not on file    Non-medical: Not on file  Tobacco Use  . Smoking status: Never Smoker  . Smokeless tobacco: Never Used  Substance and Sexual Activity  . Alcohol use: No  . Drug use: No  . Sexual activity: Not on file  Lifestyle  . Physical activity    Days per week: Not  on file    Minutes per session: Not on file  . Stress: Not on file  Relationships  . Social Herbalist on phone: Not on file    Gets together: Not on file    Attends religious service: Not on file    Active member of club or organization: Not on file    Attends meetings of clubs or organizations: Not on file    Relationship status: Not on file  . Intimate partner violence    Fear of current or ex partner: Not on file    Emotionally abused: Not on file    Physically abused: Not on file    Forced sexual activity: Not on file  Other Topics Concern  . Not on file  Social History Narrative  . 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 Accolate [Zafirlukast] Hives  . Aspirin Other (See Comments)    wheezing  . Augmentin [Amoxicillin-Pot Clavulanate] Hives and Nausea And Vomiting  . Codeine Nausea And Vomiting  . Erythromycin Nausea And Vomiting  . Lyrica [Pregabalin] Other (See Comments)    Suicidal thoughts   . Peanut-Containing Drug Products Other (See Comments)    Wheezing      Outpatient Medications Prior to Visit  Medication Sig Dispense Refill  . acetaminophen (TYLENOL) 500 MG tablet Take 1,000 mg by mouth every 6 (six) hours as needed for headache (pain).    Marland Kitchen albuterol (PROAIR HFA) 108 (90 Base) MCG/ACT inhaler Inhale 2 puffs into the lungs every 6 (six) hours as needed for wheezing or shortness of breath.    . Brexpiprazole (REXULTI) 1 MG TABS Take 1 mg by mouth daily.    Marland Kitchen buPROPion (WELLBUTRIN XL) 300 MG 24 hr tablet Take 300 mg by mouth daily.    . Cholecalciferol (VITAMIN D-3) 5000 units TABS Take 5,000 Units by mouth daily.    . DULoxetine (CYMBALTA) 60 MG capsule Take 120 mg by mouth daily.     . Estradiol-Norethindrone Acet 0.5-0.1 MG tablet Take 1 tablet by mouth daily.    . fluocinonide cream (LIDEX) 4.78 % Apply 1 application topically 2 (two) times daily as needed (rash).   2  . hydrochlorothiazide (HYDRODIURIL) 25 MG tablet Take 25 mg by mouth daily.    . hydroxypropyl methylcellulose / hypromellose (ISOPTO TEARS / GONIOVISC) 2.5 % ophthalmic solution Place 1 drop into the left eye 4 (four) times daily as needed for dry eyes. 15 mL 12  . ibuprofen (ADVIL,MOTRIN) 200 MG tablet Take 400-600 mg by mouth every 6 (six) hours as needed for headache (pain).    Marland Kitchen loratadine (CLARITIN) 10 MG tablet Take 10 mg by mouth daily as needed (pet allergies).    . modafinil (PROVIGIL) 200 MG tablet Take 200 mg by mouth 2 (two) times daily. 1 in the morning and  Half in the afternoon.    . Multiple Vitamin (MULTIVITAMIN  WITH MINERALS) TABS tablet Take 1 tablet by mouth at bedtime.    Marland Kitchen NITROFURANTOIN PO Take 100 mg by mouth daily.    Vladimir Faster Glycol-Propyl Glycol (SYSTANE OP) Place 1 drop into both eyes daily as needed (dry eyes/ irritation).    . potassium chloride SA (K-DUR,KLOR-CON) 20 MEQ tablet Take 20 mEq by mouth daily.    Marland Kitchen PRESCRIPTION MEDICATION Inhale into the lungs at bedtime. CPAP    . rOPINIRole (REQUIP) 3  MG tablet Take 3 mg by mouth at bedtime.      . simvastatin (ZOCOR) 20 MG tablet Take 20 mg by mouth at bedtime.      Marland Kitchen SYNTHROID 200 MCG tablet Take 1 tablet daily before b'fast 90 tablet 3  . topiramate (TOPAMAX) 100 MG tablet Take 200 mg by mouth 2 (two) times daily.     . valACYclovir (VALTREX) 1000 MG tablet Take 1 tablet (1,000 mg total) by mouth 3 (three) times daily. 21 tablet 0  . zolpidem (AMBIEN) 10 MG tablet Take 10 mg by mouth at bedtime.       No facility-administered medications prior to visit.         Objective:   Physical Exam  Vitals:   05/19/19 1004  BP: 128/78  Pulse: 77  Temp: (!) 96.4 F (35.8 C)  TempSrc: Oral  SpO2: 97%  Weight: 262 lb (118.8 kg)  Height: 5' 7.8" (1.722 m)   Gen: Pleasant, obese woman, in no distress,  normal affect  ENT: No lesions, crowded posterior pharynx, mouth clear,  oropharynx clear, no postnasal drip  Neck: No JVD  Lungs: No use of accessory muscles, Exp wheeze on a forced exp  Cardiovascular: RRR, heart sounds normal, no murmur or gallops, trace peripheral edema especially left lower extremity  Musculoskeletal: Some muscle wasting right lower extremity  Neuro: alert, awake, non focal  Skin: Warm, no lesions or rash    Assessment & Plan:  Obstructive sleep apnea Agree with adjusting your AutoSet range on your CPAP.  Please discuss this with your CPAP physician.  Asthma Supported by her pulmonary function testing.  Borderline bronchodilator responsiveness.  Unclear whether this is driving her overall dyspnea  significantly but I do believe it needs to be treated for [place] to determine.  We will try Laba/ICS, see if she benefits, if so then continue.  She will call let me know.  Albuterol as needed.  Amyloidosis ([place]) With principally skin manifestations.  Bone marrow negative.  I do not see any evidence of amyloid on her CT scan of the chest.  We reviewed this today.  Any restrictive disease on her pulmonary function testing likely can be ascribed to her weight gain.  Baltazar Apo, MD, PhD 05/19/2019, 10:31 AM [facility] [phone] or if no answer [phone]

## 2019-05-19 NOTE — Assessment & Plan Note (Signed)
Supported by her pulmonary function testing.  Borderline bronchodilator responsiveness.  Unclear whether this is driving her overall dyspnea significantly but I do believe it needs to be treated for [place] to determine.  We will try Laba/ICS, see if she benefits, if so then continue.  She will call let me know.  Albuterol as needed.

## 2019-05-20 NOTE — Telephone Encounter (Signed)
Notified patient of message from Dr. Cruzita Lederer, patient states she has not missed any doses or changed how she takes it.  Patient states the result was 11.58.  Patient expressed understanding and agreement. No further questions.  I have sent a request to get the labs faxed to [place] again.

## 2019-05-21 ENCOUNTER — Other Ambulatory Visit: Payer: Self-pay | Admitting: Internal Medicine

## 2019-07-08 ENCOUNTER — Other Ambulatory Visit: Payer: Self-pay | Admitting: Internal Medicine

## 2019-07-08 DIAGNOSIS — E039 Hypothyroidism, unspecified: Secondary | ICD-10-CM

## 2019-07-09 ENCOUNTER — Other Ambulatory Visit: Payer: Self-pay

## 2019-07-09 ENCOUNTER — Other Ambulatory Visit (INDEPENDENT_AMBULATORY_CARE_PROVIDER_SITE_OTHER): Payer: 59

## 2019-07-09 DIAGNOSIS — E039 Hypothyroidism, unspecified: Secondary | ICD-10-CM

## 2019-07-10 ENCOUNTER — Encounter: Payer: Self-pay | Admitting: Internal Medicine

## 2019-07-10 ENCOUNTER — Other Ambulatory Visit: Payer: Self-pay | Admitting: Internal Medicine

## 2019-07-10 DIAGNOSIS — E039 Hypothyroidism, unspecified: Secondary | ICD-10-CM

## 2019-07-10 LAB — T4, FREE: Free T4: 1.07 ng/dL (ref 0.60–1.60)

## 2019-07-10 LAB — TSH: TSH: 8.1 u[IU]/mL — ABNORMAL HIGH (ref 0.35–4.50)

## 2019-07-10 MED ORDER — SYNTHROID 25 MCG PO TABS: 25.0000 ug | ORAL_TABLET | Freq: Every day | ORAL | 3 refills | Status: DC

## 2019-09-30 ENCOUNTER — Other Ambulatory Visit: Payer: Self-pay

## 2019-09-30 ENCOUNTER — Other Ambulatory Visit (INDEPENDENT_AMBULATORY_CARE_PROVIDER_SITE_OTHER): Payer: 59

## 2019-09-30 DIAGNOSIS — E039 Hypothyroidism, unspecified: Secondary | ICD-10-CM

## 2019-09-30 LAB — TSH: TSH: 2.89 u[IU]/mL (ref 0.35–4.50)

## 2019-09-30 LAB — T4, FREE: Free T4: 1.09 ng/dL (ref 0.60–1.60)

## 2020-01-07 ENCOUNTER — Other Ambulatory Visit: Payer: Self-pay | Admitting: Internal Medicine

## 2020-02-04 ENCOUNTER — Other Ambulatory Visit: Payer: Self-pay | Admitting: Internal Medicine

## 2020-02-09 ENCOUNTER — Telehealth: Payer: Self-pay | Admitting: Internal Medicine

## 2020-02-09 MED ORDER — SYNTHROID 25 MCG PO TABS: ORAL_TABLET | ORAL | 0 refills | Status: DC

## 2020-02-09 NOTE — Telephone Encounter (Signed)
RX sent.  Patient overdue for appointment.

## 2020-02-09 NOTE — Telephone Encounter (Signed)
Medication Refill Request  Did you call your pharmacy and request this refill first? Yes  . If patient has not contacted pharmacy first, instruct them to do so for future refills.  . Remind them that contacting the pharmacy for their refill is the quickest method to get the refill.  . Refill policy also stated that it will take anywhere between 24-72 hours to receive the refill.    Name of medication? Synthroid 25 MCG  Is this a 90 day supply? Unknown   . Name and location of pharmacy? [facility] at [place]

## 2020-03-04 ENCOUNTER — Other Ambulatory Visit: Payer: Self-pay

## 2020-03-08 ENCOUNTER — Ambulatory Visit (INDEPENDENT_AMBULATORY_CARE_PROVIDER_SITE_OTHER): Payer: 59 | Admitting: Internal Medicine

## 2020-03-08 ENCOUNTER — Encounter: Payer: Self-pay | Admitting: Internal Medicine

## 2020-03-08 ENCOUNTER — Other Ambulatory Visit: Payer: Self-pay

## 2020-03-08 VITALS — BP 118/60 | HR 74 | Ht 67.5 in | Wt 236.0 lb

## 2020-03-08 DIAGNOSIS — E039 Hypothyroidism, unspecified: Secondary | ICD-10-CM | POA: Diagnosis not present

## 2020-03-08 DIAGNOSIS — E041 Nontoxic single thyroid nodule: Secondary | ICD-10-CM

## 2020-03-08 NOTE — Progress Notes (Addendum)
Patient ID: Natalie Camacho, female   DOB: 1966-06-07, 54 y.o.   MRN: [id number]   This visit occurred during the SARS-CoV-2 public health emergency.  Safety protocols were in place, including screening questions prior to the visit, additional usage of staff PPE, and extensive cleaning of exam room while observing appropriate contact time as indicated for disinfecting solutions.   HPI  Natalie Camacho is a 54 y.o.-year-old female, presenting for follow-up for hypothyroidism and thyroid cyst.  Last visit 1 year and 1.5 months ago (virtual).  Since last OV, he lost ~30 lbs - cut out sodas, sugary treats, starches.  Pt. has been dx with goiter and hypothyroidism in 1992>> on high doses of levothyroxine, dose increased to 225 mcg daily in 07/2019.  TSH normalized on this dose in 09/2019.  Patient takes her Synthroid: - in am (around 5 AM) - fasting - at least 30 min from b'fast - no Ca, Fe, PPIs - + Multivitamins at night - not on Biotin  Reviewed her TFTs: Lab Results  Component Value Date   TSH 2.89 09/30/2019   TSH 8.10 (H) 07/09/2019   TSH 0.49 07/23/2018   TSH 1.95 01/21/2018   TSH 1.19 11/13/2017   FREET4 1.09 09/30/2019   FREET4 1.07 07/09/2019   FREET4 1.04 07/23/2018   FREET4 0.91 01/21/2018   FREET4 0.97 11/13/2017  05/2019: Reported TSH 11.58. 05/06/2018: TSH 0.57 10/08/2017: TSH 0.22 -she was on biotin at the time 08/10/2017: TSH 0.85, free T4 1.03 07/09/2017: TSH 8.70, free T4 0.76 06/14/2017: TSH 5.51, free T4, 0.65 12/26/2016: TSH 3.08, free T4 0.8   Thyroid ultrasound 06/29/2017: 0.9 cm small cystic nodule in the right inferior thyroid lobe  She continues to have menopausal hot flashes>> better on estradiol.  Pt mentions: - no feeling nodules in neck - no hoarseness + occasional dysphagia + occasional choking - no SOB with lying down, but occasionally with turning head, raising arms She has OSA-on CPAP.  She has + FH of thyroid disorders in: hypothyroidism  in mother and sister. No FH of thyroid cancer. No h/o radiation tx to head or neck.  No seaweed or kelp. No recent contrast studies. No herbal supplements. No Biotin use. No recent steroids use.   Pt. also has a history of GERD, iron deficiency anemia, asthma (on Albuterol).  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  She has prediabetes.  Latest HbA1c from 11/10/2019 was 6.3%.  She has back pain and gets nerve blocks every 3-4 months through the pain clinic.  ROS: Constitutional: no weight gain/+ weight loss, no fatigue, no subjective hyperthermia, no subjective hypothermia Eyes: no blurry vision, no xerophthalmia ENT: no sore throat, + see HPI Cardiovascular: no CP/no SOB/no palpitations/no leg swelling Respiratory: no cough/no SOB/no wheezing Gastrointestinal: no N/no V/no D/no C/no acid reflux Musculoskeletal: no muscle aches/no joint aches Skin: no rashes, no hair loss Neurological: no tremors/no numbness/no tingling/no dizziness  I reviewed pt's medications, allergies, PMH, social hx, family hx, and changes were documented in the history of present illness. Otherwise, unchanged from my initial visit note.  Past Medical History:  Diagnosis Date  . Anemia   . Anxiety   . Asthma   . Depression   . GERD (gastroesophageal reflux disease)   . Headache(784.0)   . Heart murmur   . Hypertension   . Hypothyroidism   . PONV (postoperative nausea and vomiting)   . Sleep apnea    Past Surgical History:  Procedure Laterality  Date  . ABDOMINOPLASTY    . CARPAL TUNNEL RELEASE    . FRACTURE SURGERY     ORIF rt ankle, tendon sheath removed  . fundal     fundal plication  . HERNIA REPAIR  2001  . NOSE SURGERY    . RECTOCELE REPAIR     Social History   Socioeconomic History  . Marital status: Married    Spouse name: Not on file  . Number of children: 1  Social Needs  Occupational History  .  Retired  Tobacco Use  . Smoking status: Never Smoker  .  Smokeless tobacco: Never Used  Substance and Sexual Activity  . Alcohol use:  Liquor 3 times a year, 1-2 drinks  . Drug use: No   Current Outpatient Medications on File Prior to Visit  Medication Sig Dispense Refill  . acetaminophen (TYLENOL) 500 MG tablet Take 1,000 mg by mouth every 6 (six) hours as needed for headache (pain).    Marland Kitchen albuterol (PROAIR HFA) 108 (90 Base) MCG/ACT inhaler Inhale 2 puffs into the lungs every 6 (six) hours as needed for wheezing or shortness of breath.    . Brexpiprazole (REXULTI) 1 MG TABS Take 1 mg by mouth daily.    . budesonide-formoterol (SYMBICORT) 160-4.5 MCG/ACT inhaler Inhale 2 puffs into the lungs 2 (two) times daily. 1 Inhaler 6  . buPROPion (WELLBUTRIN XL) 300 MG 24 hr tablet Take 300 mg by mouth daily.    . Cholecalciferol (VITAMIN D-3) 5000 units TABS Take 5,000 Units by mouth daily.    . DULoxetine (CYMBALTA) 60 MG capsule Take 120 mg by mouth daily.     . Estradiol-Norethindrone Acet 0.5-0.1 MG tablet Take 1 tablet by mouth daily.    . fluocinonide cream (LIDEX) 5.46 % Apply 1 application topically 2 (two) times daily as needed (rash).   2  . hydrochlorothiazide (HYDRODIURIL) 25 MG tablet Take 25 mg by mouth daily.    . hydroxypropyl methylcellulose / hypromellose (ISOPTO TEARS / GONIOVISC) 2.5 % ophthalmic solution Place 1 drop into the left eye 4 (four) times daily as needed for dry eyes. 15 mL 12  . ibuprofen (ADVIL,MOTRIN) 200 MG tablet Take 400-600 mg by mouth every 6 (six) hours as needed for headache (pain).    Marland Kitchen loratadine (CLARITIN) 10 MG tablet Take 10 mg by mouth daily as needed (pet allergies).    . modafinil (PROVIGIL) 200 MG tablet Take 200 mg by mouth 2 (two) times daily. 1 in the morning and  Half in the afternoon.    . Multiple Vitamin (MULTIVITAMIN WITH MINERALS) TABS tablet Take 1 tablet by mouth at bedtime.    Marland Kitchen NITROFURANTOIN PO Take 100 mg by mouth daily.    Vladimir Faster Glycol-Propyl Glycol (SYSTANE OP) Place 1 drop into both  eyes daily as needed (dry eyes/ irritation).    . potassium chloride SA (K-DUR,KLOR-CON) 20 MEQ tablet Take 20 mEq by mouth daily.    Marland Kitchen PRESCRIPTION MEDICATION Inhale into the lungs at bedtime. CPAP    . rOPINIRole (REQUIP) 3 MG tablet Take 3 mg by mouth at bedtime.      . simvastatin (ZOCOR) 20 MG tablet Take 20 mg by mouth at bedtime.      Marland Kitchen SYNTHROID 200 MCG tablet TAKE 1 TABLET BY MOUTH  BEFORE BREAKFAST 90 tablet 3  . SYNTHROID 25 MCG tablet TAKE 1 TABLET(25 MCG) BY MOUTH DAILY BEFORE BREAKFAST. NO FURTHER REFILLS WITHOUT APPOINTMENT. 30 tablet 0  . topiramate (TOPAMAX) 100 MG  tablet Take 200 mg by mouth 2 (two) times daily.     . valACYclovir (VALTREX) 1000 MG tablet Take 1 tablet (1,000 mg total) by mouth 3 (three) times daily. 21 tablet 0  . zolpidem (AMBIEN) 10 MG tablet Take 10 mg by mouth at bedtime.       No current facility-administered medications on file prior to visit.   Allergies  Allergen Reactions  . Accolate [Zafirlukast] Hives  . Aspirin Other (See Comments)    wheezing  . Augmentin [Amoxicillin-Pot Clavulanate] Hives and Nausea And Vomiting  . Codeine Nausea And Vomiting  . Erythromycin Nausea And Vomiting  . Lyrica [Pregabalin] Other (See Comments)    Suicidal thoughts   . Peanut-Containing Drug Products Other (See Comments)    Wheezing    Family History  Problem Relation Age of Onset  . Hypothyroidism Mother   . Diabetes Father   Also,  heart disease in grandmother  diabetes in sister Lung cancer in grandfather  PE: BP 118/60   Pulse 74   Ht 5' 7.5" (1.715 m)   Wt 236 lb (107 kg)   SpO2 97%   BMI 36.42 kg/m  Wt Readings from Last 3 Encounters:  03/08/20 236 lb (107 kg)  05/19/19 262 lb (118.8 kg)  03/25/19 262 lb 3.2 oz (118.9 kg)   Constitutional: overweight, in NAD Eyes: PERRLA, EOMI, no exophthalmos ENT: moist mucous membranes, no thyromegaly, no cervical lymphadenopathy Cardiovascular: RRR, No MRG Respiratory: CTA B Gastrointestinal:  abdomen soft, NT, ND, BS+ Musculoskeletal: no deformities, strength intact in all 4, L foot in boot (stress fx) Skin: moist, warm, no rashes Neurological: no tremor with outstretched hands, DTR normal in all 4  ASSESSMENT: 1. Hypothyroidism due to Hashimoto's thyroiditis.  2.  Thyroid nodule  PLAN:  1. Patient with longstanding hypothyroidism, on levothyroxine therapy.  She has Hashimoto's thyroiditis based on high TPO antibodies.  We had to increase the dose of levothyroxine since last visit due to elevated TSH. - latest thyroid labs reviewed with pt >> normal: Lab Results  Component Value Date   TSH 2.89 09/30/2019   - she continues on rate d.a.w. 225 mcg daily - pt feels good on this dose.  However, she lost a significant amount of weight, 30 pounds, since last visit, we discussed that we may need to decrease the dose of her levothyroxine. - we discussed about taking the thyroid hormone every day, with water, >30 minutes before breakfast, separated by >4 hours from acid reflux medications, calcium, iron, multivitamins. Pt. is taking it correctly. - will check thyroid tests today: TSH and fT4 - If labs are abnormal, she will need to return for repeat TFTs in 1.5 months  2.  Thyroid nodule -Latest thyroid ultrasound report from 06/2017 was reviewed and this showed a small subcentimeter cyst (0.9 cm). -At last visit, she denied neck compression symptoms, however, since then, she mentions occasional dysphagia, pressure and shortness of breath when turning head and raising arms -We will recheck a thyroid ultrasound  Component     Latest Ref Rng & Units 03/08/2020  T4,Free(Direct)     0.60 - 1.60 ng/dL 1.48  TSH     0.35 - 4.50 uIU/mL 0.18 (L)   As expected, TSH is suppressed >> we will decrease the dose to 200 mcg daily and recheck her TFTs in 1.5 months.  Thyroid ultrasound (03/16/2020): COMPARISON:  06/28/2017  FINDINGS: Parenchymal Echotexture: Choose 4 Isthmus: 3 mm Right  lobe: 3.6 x 1.2 x 1.5 cm  Left lobe: 4.7 x 1.1 x 1.2 cm _________________________________________________________  Nodule # 1: Location: Right; Mid Maximum size: 1.7 cm; Other 2 dimensions: 1.2 x 1.2 cm Composition: solid/almost completely solid (2) Echogenicity: hypoechoic (2) Echogenic foci: macrocalcifications (1)  **Given size (>/= 1.5 cm) and appearance, fine needle aspiration of this moderately suspicious nodule should be considered based on TI-RADS criteria. _____________________________________________________  No significant left thyroid abnormality or nodule. No hypervascularity or regional adenopathy.  IMPRESSION: 1.7 cm right mid thyroid TR 4 nodule meets criteria for biopsy as above.  The above is in keeping with the ACR TI-RADS recommendations - J Am Coll Radiol 2017;14:587-595.   Electronically Signed   By: Jerilynn Mages.  Shick M.D.   On: 03/16/2020 15:40  Reviewing the images of the 2021 in 2018 thyroid ultrasounds, it is possible that the new thyroid nodule developed from the previous cystic nodule.  Will discuss with her about getting an FNA.  [name], MD PhD [facility] Endocrinology

## 2020-03-08 NOTE — Patient Instructions (Addendum)
Please continue Synthroid  225 mcg daily.  Take the thyroid hormone every day, with water, at least 30 minutes before breakfast, separated by at least 4 hours from: - acid reflux medications - calcium - iron - multivitamins  Please stop at the lab.  Please return in 1 year but for labs in 6 months.

## 2020-03-09 LAB — T4, FREE: Free T4: 1.48 ng/dL (ref 0.60–1.60)

## 2020-03-09 LAB — TSH: TSH: 0.18 u[IU]/mL — ABNORMAL LOW (ref 0.35–4.50)

## 2020-03-16 ENCOUNTER — Ambulatory Visit
Admission: RE | Admit: 2020-03-16 | Discharge: 2020-03-16 | Disposition: A | Discharge status: Home / Self Care | Payer: 59 | Source: Ambulatory Visit | Attending: Internal Medicine | Admitting: Internal Medicine

## 2020-03-16 ENCOUNTER — Encounter: Payer: Self-pay | Admitting: Internal Medicine

## 2020-03-16 DIAGNOSIS — E041 Nontoxic single thyroid nodule: Secondary | ICD-10-CM

## 2020-03-17 ENCOUNTER — Other Ambulatory Visit: Payer: Self-pay | Admitting: Internal Medicine

## 2020-03-17 DIAGNOSIS — E041 Nontoxic single thyroid nodule: Secondary | ICD-10-CM

## 2020-03-18 ENCOUNTER — Ambulatory Visit
Admission: RE | Admit: 2020-03-18 | Discharge: 2020-03-18 | Disposition: A | Discharge status: Home / Self Care | Payer: 59 | Source: Ambulatory Visit | Attending: Internal Medicine | Admitting: Internal Medicine

## 2020-03-18 ENCOUNTER — Other Ambulatory Visit (HOSPITAL_COMMUNITY)
Admission: RE | Admit: 2020-03-18 | Discharge: 2020-03-18 | Disposition: A | Discharge status: Home / Self Care | Payer: 59 | Source: Ambulatory Visit | Attending: Radiology | Admitting: Radiology

## 2020-03-18 DIAGNOSIS — E041 Nontoxic single thyroid nodule: Secondary | ICD-10-CM | POA: Diagnosis not present

## 2020-03-22 LAB — CYTOLOGY - NON PAP

## 2020-03-24 NOTE — Telephone Encounter (Signed)
Patient calling stating she got the results from the Biopsy but she is unsure what it means - wishes for nurse or Dr to give her a call to discuss post procedure instructions. Ph# [phone].

## 2020-04-09 ENCOUNTER — Encounter: Payer: Self-pay | Admitting: Internal Medicine

## 2020-04-13 ENCOUNTER — Encounter (HOSPITAL_COMMUNITY): Payer: Self-pay

## 2020-04-21 ENCOUNTER — Other Ambulatory Visit: Payer: Self-pay

## 2020-04-21 ENCOUNTER — Other Ambulatory Visit (INDEPENDENT_AMBULATORY_CARE_PROVIDER_SITE_OTHER): Payer: 59

## 2020-04-21 DIAGNOSIS — E039 Hypothyroidism, unspecified: Secondary | ICD-10-CM | POA: Diagnosis not present

## 2020-04-21 LAB — TSH: TSH: 0.28 u[IU]/mL — ABNORMAL LOW (ref 0.35–4.50)

## 2020-04-21 LAB — T4, FREE: Free T4: 1.14 ng/dL (ref 0.60–1.60)

## 2020-04-22 ENCOUNTER — Other Ambulatory Visit: Payer: Self-pay | Admitting: Internal Medicine

## 2020-04-22 DIAGNOSIS — E039 Hypothyroidism, unspecified: Secondary | ICD-10-CM

## 2020-04-22 MED ORDER — SYNTHROID 175 MCG PO TABS: 175.0000 ug | ORAL_TABLET | Freq: Every day | ORAL | 3 refills | Status: DC

## 2020-05-25 ENCOUNTER — Other Ambulatory Visit: Payer: Self-pay | Admitting: Family Medicine

## 2020-05-25 DIAGNOSIS — E2839 Other primary ovarian failure: Secondary | ICD-10-CM

## 2020-06-02 ENCOUNTER — Other Ambulatory Visit: Payer: Self-pay

## 2020-06-02 ENCOUNTER — Other Ambulatory Visit: Payer: 59

## 2020-06-02 ENCOUNTER — Ambulatory Visit
Admission: RE | Admit: 2020-06-02 | Discharge: 2020-06-02 | Disposition: A | Discharge status: Home / Self Care | Payer: 59 | Source: Ambulatory Visit | Attending: Family Medicine | Admitting: Family Medicine

## 2020-06-02 DIAGNOSIS — E2839 Other primary ovarian failure: Secondary | ICD-10-CM

## 2020-06-09 ENCOUNTER — Other Ambulatory Visit: Payer: Self-pay | Admitting: Internal Medicine

## 2020-07-27 ENCOUNTER — Other Ambulatory Visit: Payer: Self-pay

## 2020-07-27 ENCOUNTER — Encounter: Payer: Self-pay | Admitting: Internal Medicine

## 2020-07-27 ENCOUNTER — Other Ambulatory Visit (INDEPENDENT_AMBULATORY_CARE_PROVIDER_SITE_OTHER): Payer: 59

## 2020-07-27 ENCOUNTER — Other Ambulatory Visit: Payer: Self-pay | Admitting: Internal Medicine

## 2020-07-27 DIAGNOSIS — E039 Hypothyroidism, unspecified: Secondary | ICD-10-CM | POA: Diagnosis not present

## 2020-07-27 LAB — TSH: TSH: 20.87 u[IU]/mL — ABNORMAL HIGH (ref 0.35–4.50)

## 2020-07-27 LAB — T4, FREE: Free T4: 0.74 ng/dL (ref 0.60–1.60)

## 2020-07-27 MED ORDER — SYNTHROID 200 MCG PO TABS: ORAL_TABLET | ORAL | 3 refills | Status: DC

## 2020-08-04 ENCOUNTER — Other Ambulatory Visit: Payer: Self-pay | Admitting: Surgery

## 2020-08-04 DIAGNOSIS — R1033 Periumbilical pain: Secondary | ICD-10-CM

## 2020-08-19 ENCOUNTER — Ambulatory Visit
Admission: RE | Admit: 2020-08-19 | Discharge: 2020-08-19 | Disposition: A | Discharge status: Home / Self Care | Payer: 59 | Source: Ambulatory Visit | Attending: Surgery | Admitting: Surgery

## 2020-08-19 DIAGNOSIS — R1033 Periumbilical pain: Secondary | ICD-10-CM

## 2020-08-19 MED ORDER — IOPAMIDOL (ISOVUE-300) INJECTION 61%
100.0000 mL | Freq: Once | INTRAVENOUS | Status: AC | PRN
  Administered 2020-08-19: 100 mL via INTRAVENOUS

## 2020-08-23 ENCOUNTER — Ambulatory Visit: Payer: Self-pay | Admitting: Surgery

## 2020-08-23 NOTE — H&P (Signed)
History of Present Illness Natalie Camacho. Natalie Mcloughlin MD; 08/23/2020 2:21 PM) The patient is a 54 year old female who presents with an incisional hernia. PCP - Dr. Jonathon Camacho Referred for enlarging umbilical hernia  This is a 54 year old female who is a former patient of mine s/p excision of a nipple cyst. In 1994, she underwent open Nissen fundoplication by Dr. Kathrin Camacho. She developed a ventral hernia which was repaired in 2001 by Dr. Towanda Camacho. Mesh was used, according to the patient, but it is unclear whether this was onlay or inlay. Over the last year, she developed protrusion at the umbilicus with increasing burning and tenderness radiating away from the umbilicus. No GI obstructive symptoms.   Due to her extensive past surgical history, I recommended a CT scan to evaluate her previous ventral hernia repair as well as evaluate the new hernia. This revealed only a fat-containing umbilical incisional hernia measuring 2.1 cm the fascial surface. She also has incidental finding of several distinct soft tissue nodules in the abdominal wall. Upon further questioning, patient states that she can feel these are self that they are not causing any pain or problems. She does have one that is inside of her umbilical hernia easily seen on CT scan.  CLINICAL DATA: Abdominal pain. Evaluate for hernia.  EXAM: CT ABDOMEN AND PELVIS WITH CONTRAST  TECHNIQUE: Multidetector CT imaging of the abdomen and pelvis was performed using the standard protocol following bolus administration of intravenous contrast.  CONTRAST: 129mL ISOVUE-300 IOPAMIDOL (ISOVUE-300) INJECTION 61%  COMPARISON: 10/04/2006  FINDINGS: Lower chest: Scarring and architectural distortion noted within the medial left lower lobe. No acute abnormality identified.  Hepatobiliary: No focal liver abnormality is seen. No gallstones, gallbladder wall thickening, or biliary dilatation.  Pancreas: Unremarkable. No pancreatic  ductal dilatation or surrounding inflammatory changes.  Spleen: Normal in size without focal abnormality.  Adrenals/Urinary Tract: Normal appearance of the adrenal glands. Right kidney is unremarkable. Large branching stone is identified within the mid and inferior pole collecting system of the left kidney measuring 2.3 x 1.0 cm. Separate stone within the inferior pole of left kidney measures 5 mm. No hydronephrosis or hydroureter identified bilaterally. Urinary bladder is normal.  Stomach/Bowel: Stomach is unremarkable. Signs of previous hiatal hernia repair noted. The appendix is not visualized. No secondary signs of acute appendicitis. No bowel wall thickening, inflammation, or distension.  Vascular/Lymphatic: Aortic atherosclerosis. No aneurysm. No abdominopelvic adenopathy identified.  Reproductive: The uterus has a heterogeneous appearance with small subserosal fibroid arising off the left uterine fundus measuring 2.3 cm. No adnexal mass identified.  Other: Fat containing umbilical hernia measures 2.1 cm, image 113/8. Small enhancing soft tissue nodule is noted along the right lateral aspect of the hernia measuring 7 mm, image 60/2.  Within the infraumbilical abdominal wall there are several distinct soft tissue nodules superficial to the rectus muscle along the midline. The largest measures 3.8 x 1.3 cm. At a slightly higher level there is a smaller nodule measuring 1.7 cm, image 72/2.  No free fluid or fluid collections identified  Musculoskeletal: No acute or significant osseous findings.  IMPRESSION: 1. No acute findings identified within the abdomen or pelvis. 2. Fat containing umbilical hernia measures 2.1 cm. 3. There are several distinct soft tissue nodules within the infraumbilical abdominal wall superficial to the rectus muscle along the midline. The largest measures 3.8 x 1.3 cm. These are of uncertain clinical significance. Potential diagnostic  considerations include scar endometriomas versus areas of granulation tissue. If there are  clinical signs or symptoms to suggest endometriosis more definitive characterization could be obtained with contrast enhanced pelvic MRI. Otherwise consider follow-up imaging in 3-6 months with repeat CT or MRI to ensure stability of these likely benign abnormalities. 4. Aortic atherosclerosis.  Aortic Atherosclerosis (ICD10-I70.0).   Electronically Signed By: Natalie Camacho M.D. On: 08/20/2020 16:29   Problem List/Past Medical Natalie Key K. 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 Natalie Rau, MD; 08/23/2020 2:22 PM) Foot Surgery  Right. Nissen Fundoplication  Oral Surgery  Ventral / Umbilical Hernia Surgery  Bilateral.  Diagnostic Studies History Natalie Camacho. Natalie Schoff, MD; 08/23/2020 2:22 PM) Colonoscopy  >10 years ago Mammogram  within last year Pap Smear  1-5 years ago  Allergies Natalie Camacho, RMA; 08/23/2020 9:00 AM) Accolate *ANTIASTHMATIC AND BRONCHODILATOR AGENTS*  Hives. Aspirin *ANALGESICS - NonNarcotic*  Augmentin *PENICILLINS*  Hives, Nausea, Vomiting. Codeine Phosphate *ANALGESICS - OPIOID*  Nausea, Vomiting. Erythromycin *DERMATOLOGICALS*  Nausea, Vomiting. Lyrica *ANTICONVULSANTS*  Peanut (Diagnostic) *DIAGNOSTIC PRODUCTS*  Allergies Reconciled   Medication History (Natalie Camacho, RMA; 08/23/2020 9:00 AM) Synthroid (175MCG Tablet, Oral) Active. Rexulti (1MG  Tablet, Oral) Active. Zolpidem Tartrate (10MG  Tablet, Oral) Active. BuPROPion HCl ER (XL) (300MG  Tablet ER 24HR, Oral) Active. DULoxetine HCl (60MG  Capsule DR Part, Oral) Active. Fluocinonide (0.05% Cream, External) Active. HydroCHLOROthiazide (25MG  Tablet, Oral) Active. Ibuprofen (600MG  Tablet, Oral) Active. Jublia  (10% Solution, External) Active. Levothyroxine Sodium (200MCG Tablet, Oral) Active. Potassium Chloride Crys ER (20MEQ Tablet ER, Oral) Active. ROPINIRole HCl (3MG  Tablet, Oral) Active. Simvastatin (20MG  Tablet, Oral) Active. Topiramate (100MG  Tablet, Oral) Active. ValACYclovir HCl (1GM Tablet, Oral) Active. Albuterol (90MCG/ACT Aerosol Soln, Inhalation) Active. Brexpiprazole (1MG  Tablet, Oral) Active. Medications Reconciled  Social History Natalie Camacho. Natalie Livingstone, MD; 08/23/2020 2:22 PM) Alcohol use  Occasional alcohol use. Caffeine use  Carbonated beverages, Coffee, Tea. No drug use  Tobacco use  Never smoker.  Family History Natalie Camacho. Natalie Dault, MD; 08/23/2020 2:22 PM) Anesthetic complications  Mother. Colon Polyps  Mother. Depression  Father, Sister. Diabetes Mellitus  Father, Sister. Respiratory Condition  Son. Thyroid problems  Mother, Sister.  Pregnancy / Birth History Natalie Camacho. Natalie Braithwaite, MD; 08/23/2020 2:22 PM) Age at menarche  65 years. Contraceptive History  Oral contraceptives. Gravida  3 Length (months) of breastfeeding  3-6 Maternal age  5-30 Para  1 Regular periods   Other Problems Natalie Camacho. Natalie Cancel, MD; 08/23/2020 2:22 PM) Asthma  Depression  Gastroesophageal Reflux Disease  General anesthesia - complications  High blood pressure  Hypercholesterolemia  Migraine Headache  Sleep Apnea  Thyroid Disease  Ventral Hernia Repair   Vitals (Natalie Camacho RMA; 08/23/2020 9:01 AM) 08/23/2020 9:00 AM Weight: 224.4 lb Height: 67.5in Body Surface Area: 2.13 m Body Mass Index: 34.63 kg/m  Temp.: 97.63F (Temporal)  Pulse: 76 (Regular)  P.OX: 97% (Room air) BP: 122/80(Sitting, Left Arm, Standard)       Physical Exam Natalie Key K. Lynisha Osuch MD; 08/23/2020 2:22 PM) The physical exam findings are as follows: Note: Constitutional: WDWN in NAD, conversant, no obvious deformities; resting comfortably Eyes: Pupils equal,  round; sclera anicteric; moist conjunctiva; no lid lag HENT: Oral mucosa moist; good dentition Neck: No masses palpated, trachea midline; no thyromegaly Lungs: CTA bilaterally; normal respiratory effort CV: Regular rate and rhythm; no murmurs; extremities well-perfused with no edema Abd: +bowel sounds, soft, non-tender, no palpable organomegaly; long midline incision - intact, no sign of infection. The underlying fascia is  quite firm to palpation. There is an obvious bulge at the umbilicus which enlarges with valsalva. I cannot clearly palpate any other hernias along the midline. Musc: Normal gait; no apparent clubbing or cyanosis in extremities Lymphatic: No palpable cervical or axillary lymphadenopathy Skin: Warm, dry; no sign of jaundice Psychiatric - alert and oriented x 4; calm mood and affect    Assessment & Plan Natalie Key K. Irva Loser MD; 08/23/2020 2:23 PM) VENTRAL INCISIONAL HERNIA WITHOUT OBSTRUCTION OR GANGRENE (K43.2) Current Plans Schedule for Surgery - Ventral incisional hernia repair with mesh. The surgical procedure has been discussed with the patient. Potential risks, benefits, alternative treatments, and expected outcomes have been explained. All of the patient's questions at this time have been answered. The likelihood of reaching the patient's treatment goal is good. The patient understand the proposed surgical procedure and wishes to proceed.   Note:We will excise the hernia sac and sent it for pathologic examination to determine the significance of these nondistinctive soft tissue nodules.  Natalie Camacho. Georgette Dover, MD, [facility] Surgery  General/ Trauma Surgery   08/23/2020 2:28 PM

## 2020-09-16 ENCOUNTER — Other Ambulatory Visit (INDEPENDENT_AMBULATORY_CARE_PROVIDER_SITE_OTHER): Payer: 59

## 2020-09-16 ENCOUNTER — Other Ambulatory Visit: Payer: Self-pay

## 2020-09-16 DIAGNOSIS — E039 Hypothyroidism, unspecified: Secondary | ICD-10-CM | POA: Diagnosis not present

## 2020-09-16 LAB — TSH: TSH: 3.95 u[IU]/mL (ref 0.35–4.50)

## 2020-09-16 LAB — T4, FREE: Free T4: 0.91 ng/dL (ref 0.60–1.60)

## 2020-09-27 ENCOUNTER — Encounter (HOSPITAL_BASED_OUTPATIENT_CLINIC_OR_DEPARTMENT_OTHER): Payer: Self-pay | Admitting: Surgery

## 2020-09-27 ENCOUNTER — Other Ambulatory Visit: Payer: Self-pay

## 2020-10-04 ENCOUNTER — Encounter (HOSPITAL_BASED_OUTPATIENT_CLINIC_OR_DEPARTMENT_OTHER)
Admission: RE | Admit: 2020-10-04 | Discharge: 2020-10-04 | Disposition: A | Discharge status: Home / Self Care | Payer: 59 | Source: Ambulatory Visit | Attending: Surgery | Admitting: Surgery

## 2020-10-04 ENCOUNTER — Other Ambulatory Visit (HOSPITAL_COMMUNITY)
Admission: RE | Admit: 2020-10-04 | Discharge: 2020-10-04 | Disposition: A | Discharge status: Home / Self Care | Payer: 59 | Source: Ambulatory Visit | Attending: Surgery | Admitting: Surgery

## 2020-10-04 DIAGNOSIS — Z20822 Contact with and (suspected) exposure to covid-19: Secondary | ICD-10-CM | POA: Diagnosis not present

## 2020-10-04 DIAGNOSIS — I10 Essential (primary) hypertension: Secondary | ICD-10-CM | POA: Insufficient documentation

## 2020-10-04 DIAGNOSIS — Z01818 Encounter for other preprocedural examination: Secondary | ICD-10-CM | POA: Insufficient documentation

## 2020-10-04 LAB — BASIC METABOLIC PANEL
Anion gap: 12 (ref 5–15)
BUN: 14 mg/dL (ref 6–20)
CO2: 22 mmol/L (ref 22–32)
Calcium: 8.8 mg/dL — ABNORMAL LOW (ref 8.9–10.3)
Chloride: 103 mmol/L (ref 98–111)
Creatinine, Ser: 1.02 mg/dL — ABNORMAL HIGH (ref 0.44–1.00)
GFR, Estimated: >60 mL/min (ref >60)
Glucose, Bld: 160 mg/dL — ABNORMAL HIGH (ref 70–99)
Potassium: 3.6 mmol/L (ref 3.5–5.1)
Sodium: 137 mmol/L (ref 135–145)

## 2020-10-04 LAB — SARS CORONAVIRUS 2 (TAT 6-24 HRS): SARS Coronavirus 2: NEGATIVE

## 2020-10-04 NOTE — Progress Notes (Addendum)

## 2020-10-06 ENCOUNTER — Other Ambulatory Visit: Payer: Self-pay

## 2020-10-06 ENCOUNTER — Ambulatory Visit (HOSPITAL_BASED_OUTPATIENT_CLINIC_OR_DEPARTMENT_OTHER)
Admission: RE | Admit: 2020-10-06 | Discharge: 2020-10-06 | Disposition: A | Discharge status: Home / Self Care | Payer: 59 | Attending: Surgery | Admitting: Surgery

## 2020-10-06 ENCOUNTER — Ambulatory Visit (HOSPITAL_BASED_OUTPATIENT_CLINIC_OR_DEPARTMENT_OTHER): Payer: 59 | Admitting: Certified Registered"

## 2020-10-06 ENCOUNTER — Encounter (HOSPITAL_BASED_OUTPATIENT_CLINIC_OR_DEPARTMENT_OTHER): Payer: Self-pay | Admitting: Surgery

## 2020-10-06 ENCOUNTER — Encounter (HOSPITAL_BASED_OUTPATIENT_CLINIC_OR_DEPARTMENT_OTHER)
Admission: RE | Disposition: A | Discharge status: Home / Self Care | Payer: Self-pay | Source: Home / Self Care | Attending: Surgery

## 2020-10-06 DIAGNOSIS — Z881 Allergy status to other antibiotic agents status: Secondary | ICD-10-CM | POA: Insufficient documentation

## 2020-10-06 DIAGNOSIS — Z836 Family history of other diseases of the respiratory system: Secondary | ICD-10-CM | POA: Diagnosis not present

## 2020-10-06 DIAGNOSIS — K432 Incisional hernia without obstruction or gangrene: Secondary | ICD-10-CM | POA: Diagnosis present

## 2020-10-06 DIAGNOSIS — Z8349 Family history of other endocrine, nutritional and metabolic diseases: Secondary | ICD-10-CM | POA: Insufficient documentation

## 2020-10-06 DIAGNOSIS — Z833 Family history of diabetes mellitus: Secondary | ICD-10-CM | POA: Insufficient documentation

## 2020-10-06 DIAGNOSIS — Z886 Allergy status to analgesic agent status: Secondary | ICD-10-CM | POA: Diagnosis not present

## 2020-10-06 DIAGNOSIS — Z8371 Family history of colonic polyps: Secondary | ICD-10-CM | POA: Diagnosis not present

## 2020-10-06 DIAGNOSIS — Z91010 Allergy to peanuts: Secondary | ICD-10-CM | POA: Diagnosis not present

## 2020-10-06 DIAGNOSIS — Z79899 Other long term (current) drug therapy: Secondary | ICD-10-CM | POA: Diagnosis not present

## 2020-10-06 DIAGNOSIS — Z888 Allergy status to other drugs, medicaments and biological substances status: Secondary | ICD-10-CM | POA: Diagnosis not present

## 2020-10-06 DIAGNOSIS — Z791 Long term (current) use of non-steroidal anti-inflammatories (NSAID): Secondary | ICD-10-CM | POA: Insufficient documentation

## 2020-10-06 DIAGNOSIS — Z818 Family history of other mental and behavioral disorders: Secondary | ICD-10-CM | POA: Diagnosis not present

## 2020-10-06 DIAGNOSIS — Z885 Allergy status to narcotic agent status: Secondary | ICD-10-CM | POA: Diagnosis not present

## 2020-10-06 DIAGNOSIS — Z7951 Long term (current) use of inhaled steroids: Secondary | ICD-10-CM | POA: Diagnosis not present

## 2020-10-06 HISTORY — DX: Causalgia of unspecified upper limb: G56.40

## 2020-10-06 HISTORY — PX: VENTRAL HERNIA REPAIR: SHX424

## 2020-10-06 SURGERY — REPAIR, HERNIA, VENTRAL
Anesthesia: General | Site: Abdomen

## 2020-10-06 MED ORDER — HYDROCODONE-ACETAMINOPHEN 5-325 MG PO TABS: 1.0000 | ORAL_TABLET | Freq: Four times a day (QID) | ORAL | 0 refills | Status: AC | PRN

## 2020-10-06 MED ORDER — DEXAMETHASONE SODIUM PHOSPHATE 10 MG/ML IJ SOLN
INTRAMUSCULAR | Status: AC
  Filled 2020-10-06: qty 1

## 2020-10-06 MED ORDER — CEFAZOLIN SODIUM-DEXTROSE 2-4 GM/100ML-% IV SOLN
2.0000 g | INTRAVENOUS | Status: AC
  Administered 2020-10-06: 2 g via INTRAVENOUS

## 2020-10-06 MED ORDER — FENTANYL CITRATE (PF) 100 MCG/2ML IJ SOLN
25.0000 ug | INTRAMUSCULAR | Status: DC | PRN
Start: 2020-10-06 — End: 2020-10-06

## 2020-10-06 MED ORDER — FENTANYL CITRATE (PF) 100 MCG/2ML IJ SOLN
100.0000 ug | Freq: Once | INTRAMUSCULAR | Status: AC
Start: 2020-10-06 — End: 2020-10-06
  Administered 2020-10-06: 25 ug via INTRAVENOUS

## 2020-10-06 MED ORDER — FENTANYL CITRATE (PF) 100 MCG/2ML IJ SOLN
INTRAMUSCULAR | Status: AC
  Filled 2020-10-06: qty 2

## 2020-10-06 MED ORDER — ACETAMINOPHEN 500 MG PO TABS
ORAL_TABLET | ORAL | Status: AC
  Filled 2020-10-06: qty 2

## 2020-10-06 MED ORDER — MIDAZOLAM HCL 5 MG/5ML IJ SOLN
INTRAMUSCULAR | Status: DC | PRN
  Administered 2020-10-06: 2 mg via INTRAVENOUS

## 2020-10-06 MED ORDER — BUPIVACAINE HCL (PF) 0.25 % IJ SOLN
INTRAMUSCULAR | Status: AC
  Filled 2020-10-06: qty 60

## 2020-10-06 MED ORDER — OXYCODONE HCL 5 MG PO TABS
ORAL_TABLET | ORAL | Status: AC
  Filled 2020-10-06: qty 1

## 2020-10-06 MED ORDER — ACETAMINOPHEN 500 MG PO TABS
1000.0000 mg | ORAL_TABLET | ORAL | Status: AC
  Administered 2020-10-06: 1000 mg via ORAL

## 2020-10-06 MED ORDER — OXYCODONE HCL 5 MG/5ML PO SOLN: 5.0000 mg | Freq: Once | ORAL | Status: AC | PRN

## 2020-10-06 MED ORDER — MIDAZOLAM HCL 2 MG/2ML IJ SOLN
INTRAMUSCULAR | Status: AC
  Filled 2020-10-06: qty 2

## 2020-10-06 MED ORDER — ONDANSETRON HCL 4 MG/2ML IJ SOLN
INTRAMUSCULAR | Status: DC | PRN
  Administered 2020-10-06: 4 mg via INTRAVENOUS

## 2020-10-06 MED ORDER — DEXAMETHASONE SODIUM PHOSPHATE 10 MG/ML IJ SOLN
INTRAMUSCULAR | Status: DC | PRN
  Administered 2020-10-06: 5 mg via INTRAVENOUS

## 2020-10-06 MED ORDER — CHLORHEXIDINE GLUCONATE CLOTH 2 % EX PADS: 6.0000 | MEDICATED_PAD | Freq: Once | CUTANEOUS | Status: DC

## 2020-10-06 MED ORDER — GABAPENTIN 300 MG PO CAPS
ORAL_CAPSULE | ORAL | Status: AC
  Filled 2020-10-06: qty 1

## 2020-10-06 MED ORDER — FENTANYL CITRATE (PF) 100 MCG/2ML IJ SOLN
INTRAMUSCULAR | Status: DC | PRN
  Administered 2020-10-06 (×4): 25 ug via INTRAVENOUS

## 2020-10-06 MED ORDER — LACTATED RINGERS IV SOLN: INTRAVENOUS | Status: DC

## 2020-10-06 MED ORDER — GABAPENTIN 300 MG PO CAPS
300.0000 mg | ORAL_CAPSULE | ORAL | Status: AC
  Administered 2020-10-06: 300 mg via ORAL

## 2020-10-06 MED ORDER — PROPOFOL 10 MG/ML IV BOLUS
INTRAVENOUS | Status: AC
  Filled 2020-10-06: qty 20

## 2020-10-06 MED ORDER — BUPIVACAINE HCL (PF) 0.25 % IJ SOLN
INTRAMUSCULAR | Status: DC | PRN
  Administered 2020-10-06: 10 mL

## 2020-10-06 MED ORDER — OXYCODONE HCL 5 MG PO TABS
5.0000 mg | ORAL_TABLET | Freq: Once | ORAL | Status: AC | PRN
  Administered 2020-10-06: 5 mg via ORAL

## 2020-10-06 MED ORDER — LIDOCAINE HCL (CARDIAC) PF 100 MG/5ML IV SOSY
PREFILLED_SYRINGE | INTRAVENOUS | Status: DC | PRN
  Administered 2020-10-06: 100 mg via INTRAVENOUS

## 2020-10-06 MED ORDER — SCOPOLAMINE 1 MG/3DAYS TD PT72: 1.0000 | MEDICATED_PATCH | TRANSDERMAL | Status: DC

## 2020-10-06 MED ORDER — AMISULPRIDE (ANTIEMETIC) 5 MG/2ML IV SOLN: 10.0000 mg | Freq: Once | INTRAVENOUS | Status: DC | PRN

## 2020-10-06 MED ORDER — LIDOCAINE 2% (20 MG/ML) 5 ML SYRINGE
INTRAMUSCULAR | Status: AC
  Filled 2020-10-06: qty 5

## 2020-10-06 MED ORDER — PROPOFOL 10 MG/ML IV BOLUS
INTRAVENOUS | Status: DC | PRN
  Administered 2020-10-06: 200 mg via INTRAVENOUS

## 2020-10-06 MED ORDER — SCOPOLAMINE 1 MG/3DAYS TD PT72
MEDICATED_PATCH | TRANSDERMAL | Status: AC
  Filled 2020-10-06: qty 1

## 2020-10-06 MED ORDER — SCOPOLAMINE 1 MG/3DAYS TD PT72
1.0000 | MEDICATED_PATCH | TRANSDERMAL | Status: DC
  Administered 2020-10-06: 1.5 mg via TRANSDERMAL

## 2020-10-06 MED ORDER — ONDANSETRON HCL 4 MG/2ML IJ SOLN
INTRAMUSCULAR | Status: AC
  Filled 2020-10-06: qty 2

## 2020-10-06 MED ORDER — CEFAZOLIN SODIUM-DEXTROSE 2-4 GM/100ML-% IV SOLN
INTRAVENOUS | Status: AC
  Filled 2020-10-06: qty 100

## 2020-10-06 MED ORDER — ONDANSETRON HCL 4 MG/2ML IJ SOLN: 4.0000 mg | Freq: Once | INTRAMUSCULAR | Status: DC | PRN

## 2020-10-06 SURGICAL SUPPLY — 65 items
APL PRP STRL LF DISP 70% ISPRP (MISCELLANEOUS) ×2
APL SKNCLS STERI-STRIP NONHPOA (GAUZE/BANDAGES/DRESSINGS) ×2
APL SWBSTK 6 STRL LF DISP (MISCELLANEOUS)
APPLICATOR COTTON TIP 6 STRL (MISCELLANEOUS) IMPLANT
APPLICATOR COTTON TIP 6IN STRL (MISCELLANEOUS)
BENZOIN TINCTURE PRP APPL 2/3 (GAUZE/BANDAGES/DRESSINGS) ×3 IMPLANT
BLADE CLIPPER SURG (BLADE) ×1 IMPLANT
BLADE HEX COATED 2.75 (ELECTRODE) ×1 IMPLANT
BLADE SURG 15 STRL LF DISP TIS (BLADE) ×2 IMPLANT
BLADE SURG 15 STRL SS (BLADE) ×3
CANISTER SUCT 1200ML W/VALVE (MISCELLANEOUS) ×2 IMPLANT
CHLORAPREP W/TINT 26 (MISCELLANEOUS) ×3 IMPLANT
COVER BACK TABLE 60X90IN (DRAPES) ×3 IMPLANT
COVER MAYO STAND STRL (DRAPES) ×3 IMPLANT
COVER WAND RF STERILE (DRAPES) IMPLANT
DECANTER SPIKE VIAL GLASS SM (MISCELLANEOUS) IMPLANT
DRAPE LAPAROTOMY 100X72 PEDS (DRAPES) ×3 IMPLANT
DRAPE UTILITY XL STRL (DRAPES) ×3 IMPLANT
DRSG TEGADERM 4X4.75 (GAUZE/BANDAGES/DRESSINGS) ×3 IMPLANT
ELECT REM PT RETURN 9FT ADLT (ELECTROSURGICAL) ×3
ELECTRODE REM PT RTRN 9FT ADLT (ELECTROSURGICAL) ×2 IMPLANT
GAUZE SPONGE 4X4 12PLY STRL LF (GAUZE/BANDAGES/DRESSINGS) ×2 IMPLANT
GLOVE BIOGEL PI IND STRL 7.0 (GLOVE) ×2 IMPLANT
GLOVE BIOGEL PI IND STRL 7.5 (GLOVE) ×2 IMPLANT
GLOVE BIOGEL PI INDICATOR 7.0 (GLOVE) ×2
GLOVE BIOGEL PI INDICATOR 7.5 (GLOVE) ×1
GLOVE SURG ENC MOIS LTX SZ7 (GLOVE) ×3 IMPLANT
GLOVE SURG SYN 7.0 (GLOVE) ×3 IMPLANT
GLOVE SURG SYN 7.0 PF PI (GLOVE) IMPLANT
GOWN STRL REUS W/ TWL LRG LVL3 (GOWN DISPOSABLE) ×3 IMPLANT
GOWN STRL REUS W/ TWL XL LVL3 (GOWN DISPOSABLE) ×1 IMPLANT
GOWN STRL REUS W/TWL LRG LVL3 (GOWN DISPOSABLE) ×3
GOWN STRL REUS W/TWL XL LVL3 (GOWN DISPOSABLE) ×3
MESH VENTRALEX ST 1-7/10 CRC S (Mesh General) ×2 IMPLANT
NDL HYPO 25X1 1.5 SAFETY (NEEDLE) ×1 IMPLANT
NDL SAFETY ECLIPSE 18X1.5 (NEEDLE) IMPLANT
NEEDLE HYPO 18GX1.5 SHARP (NEEDLE)
NEEDLE HYPO 25X1 1.5 SAFETY (NEEDLE) ×3 IMPLANT
NS IRRIG 1000ML POUR BTL (IV SOLUTION) ×2 IMPLANT
PACK BASIN DAY SURGERY FS (CUSTOM PROCEDURE TRAY) ×3 IMPLANT
PENCIL SMOKE EVACUATOR (MISCELLANEOUS) ×3 IMPLANT
SLEEVE SCD COMPRESS KNEE MED (MISCELLANEOUS) ×3 IMPLANT
SPONGE GAUZE 2X2 8PLY STRL LF (GAUZE/BANDAGES/DRESSINGS) ×1 IMPLANT
STAPLER VISISTAT 35W (STAPLE) IMPLANT
STRIP CLOSURE SKIN 1/2X4 (GAUZE/BANDAGES/DRESSINGS) ×3 IMPLANT
SUT MNCRL AB 4-0 PS2 18 (SUTURE) ×3 IMPLANT
SUT NOVA 0 T19/GS 22DT (SUTURE) ×2 IMPLANT
SUT NOVA NAB DX-16 0-1 5-0 T12 (SUTURE) ×2 IMPLANT
SUT NOVA NAB GS-21 1 T12 (SUTURE) IMPLANT
SUT PDS AB 0 CT 36 (SUTURE) IMPLANT
SUT PROLENE 0 CT 2 (SUTURE) IMPLANT
SUT SILK 3 0 TIES 17X18 (SUTURE)
SUT SILK 3-0 18XBRD TIE BLK (SUTURE) IMPLANT
SUT VIC AB 2-0 CT1 27 (SUTURE)
SUT VIC AB 2-0 CT1 TAPERPNT 27 (SUTURE) IMPLANT
SUT VIC AB 3-0 SH 27 (SUTURE) ×3
SUT VIC AB 3-0 SH 27X BRD (SUTURE) ×2 IMPLANT
SUT VIC AB 4-0 BRD 54 (SUTURE) IMPLANT
SUT VIC AB 4-0 SH 18 (SUTURE) IMPLANT
SUT VICRYL 4-0 PS2 18IN ABS (SUTURE) IMPLANT
SYR BULB EAR ULCER 3OZ GRN STR (SYRINGE) IMPLANT
SYR CONTROL 10ML LL (SYRINGE) ×3 IMPLANT
TOWEL GREEN STERILE FF (TOWEL DISPOSABLE) ×3 IMPLANT
TUBE CONNECTING 20X1/4 (TUBING) ×3 IMPLANT
YANKAUER SUCT BULB TIP NO VENT (SUCTIONS) ×3 IMPLANT

## 2020-10-06 NOTE — Transfer of Care (Signed)
Immediate Anesthesia Transfer of Care Note  Patient: Natalie Camacho [facility]  Procedure(s) Performed: VENTRAL INCISIONAL HERNIA REPAIR WITH MESH (N/A Abdomen)  Patient Location: PACU  Anesthesia Type:General  Level of Consciousness: awake, alert  and oriented  Airway & Oxygen Therapy: Patient Spontanous Breathing and Patient connected to face mask oxygen  Post-op Assessment: Report given to RN and Post -op Vital signs reviewed and stable  Post vital signs: Reviewed and stable  Last Vitals:  Vitals Value Taken Time  BP 130/75 10/06/20 1336  Temp    Pulse 73 10/06/20 1339  Resp 11 10/06/20 1339  SpO2 100 % 10/06/20 1339  Vitals shown include unvalidated device data.  Last Pain:  Vitals:   10/06/20 1057  TempSrc: Oral  PainSc: 3       Patients Stated Pain Goal: 5 (10/06/20 1057)  Complications: No complications documented.

## 2020-10-06 NOTE — H&P (Signed)
History of Present Illness  The patient is a 55 year old female who presents with an incisional hernia. PCP - Dr. Mila Palmer Referred for enlarging umbilical hernia  This is a 55 year old female who is a former patient of mine s/p excision of a nipple cyst. In 1994, she underwent open Nissen fundoplication by Dr. Leonie Man. She developed a ventral hernia which was repaired in 2001 by Dr. Shon Hough. Mesh was used, according to the patient, but it is unclear whether this was onlay or inlay. Over the last year, she developed protrusion at the umbilicus with increasing burning and tenderness radiating away from the umbilicus. No GI obstructive symptoms.   Due to her extensive past surgical history, I recommended a CT scan to evaluate her previous ventral hernia repair as well as evaluate the new hernia. This revealed only a fat-containing umbilical incisional hernia measuring 2.1 cm the fascial surface. She also has incidental finding of several distinct soft tissue nodules in the abdominal wall. Upon further questioning, patient states that she can feel these are self that they are not causing any pain or problems. She does have one that is inside of her umbilical hernia easily seen on CT scan.  CLINICAL DATA: Abdominal pain. Evaluate for hernia.  EXAM: CT ABDOMEN AND PELVIS WITH CONTRAST  TECHNIQUE: Multidetector CT imaging of the abdomen and pelvis was performed using the standard protocol following bolus administration of intravenous contrast.  CONTRAST: ISOVUE-300 IOPAMIDOL (ISOVUE-300) INJECTION 61%  COMPARISON: 10/04/2006  FINDINGS: Lower chest: Scarring and architectural distortion noted within the medial left lower lobe. No acute abnormality identified.  Hepatobiliary: No focal liver abnormality is seen. No gallstones, gallbladder wall thickening, or biliary dilatation.  Pancreas: Unremarkable. No pancreatic ductal dilatation or surrounding  inflammatory changes.  Spleen: Normal in size without focal abnormality.  Adrenals/Urinary Tract: Normal appearance of the adrenal glands. Right kidney is unremarkable. Large branching stone is identified within the mid and inferior pole collecting system of the left kidney measuring 2.3 x 1.0 cm. Separate stone within the inferior pole of left kidney measures 5 mm. No hydronephrosis or hydroureter identified bilaterally. Urinary bladder is normal.  Stomach/Bowel: Stomach is unremarkable. Signs of previous hiatal hernia repair noted. The appendix is not visualized. No secondary signs of acute appendicitis. No bowel wall thickening, inflammation, or distension.  Vascular/Lymphatic: Aortic atherosclerosis. No aneurysm. No abdominopelvic adenopathy identified.  Reproductive: The uterus has a heterogeneous appearance with small subserosal fibroid arising off the left uterine fundus measuring 2.3 cm. No adnexal mass identified.  Other: Fat containing umbilical hernia measures 2.1 cm, image 113/8. Small enhancing soft tissue nodule is noted along the right lateral aspect of the hernia measuring 7 mm, image 60/2.  Within the infraumbilical abdominal wall there are several distinct soft tissue nodules superficial to the rectus muscle along the midline. The largest measures 3.8 x 1.3 cm. At a slightly higher level there is a smaller nodule measuring 1.7 cm, image 72/2.  No free fluid or fluid collections identified  Musculoskeletal: No acute or significant osseous findings.  IMPRESSION: 1. No acute findings identified within the abdomen or pelvis. 2. Fat containing umbilical hernia measures 2.1 cm. 3. There are several distinct soft tissue nodules within the infraumbilical abdominal wall superficial to the rectus muscle along the midline. The largest measures 3.8 x 1.3 cm. These are of uncertain clinical significance. Potential diagnostic considerations include scar  endometriomas versus areas of granulation tissue. If there are clinical signs or symptoms to suggest  endometriosis more definitive characterization could be obtained with contrast enhanced pelvic MRI. Otherwise consider follow-up imaging in 3-6 months with repeat CT or MRI to ensure stability of these likely benign abnormalities. 4. Aortic atherosclerosis.  Aortic Atherosclerosis (ICD10-I70.0).   Electronically Signed By: Kerby Moors M.D. 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 Nissen Fundoplication  Oral Surgery  Ventral / Umbilical Hernia Surgery  Bilateral.  Diagnostic Studies History  Colonoscopy  >10 years ago Mammogram  within last year Pap Smear  1-5 years ago  Allergies  Accolate *ANTIASTHMATIC AND BRONCHODILATOR AGENTS*  Hives. Aspirin *ANALGESICS - NonNarcotic*  Augmentin *PENICILLINS*  Hives, Nausea, Vomiting. Codeine Phosphate *ANALGESICS - OPIOID*  Nausea, Vomiting. Erythromycin *DERMATOLOGICALS*  Nausea, Vomiting. Lyrica *ANTICONVULSANTS*  Peanut (Diagnostic) *DIAGNOSTIC PRODUCTS*  Allergies Reconciled   Medication History  Synthroid (175MCG Tablet, Oral) Active. Rexulti (1MG  Tablet, Oral) Active. Zolpidem Tartrate (10MG  Tablet, Oral) Active. BuPROPion HCl ER (XL) (300MG  Tablet ER 24HR, Oral) Active. DULoxetine HCl (60MG  Capsule DR Part, Oral) Active. Fluocinonide (0.05% Cream, External) Active. HydroCHLOROthiazide (25MG  Tablet, Oral) Active. Ibuprofen (600MG  Tablet, Oral) Active. Jublia (10% Solution, External) Active. Levothyroxine Sodium (200MCG Tablet, Oral) Active. Potassium Chloride Crys ER (20MEQ Tablet ER, Oral) Active. ROPINIRole HCl (3MG  Tablet, Oral) Active. Simvastatin (20MG  Tablet, Oral)  Active. Topiramate (100MG  Tablet, Oral) Active. ValACYclovir HCl (1GM Tablet, Oral) Active. Albuterol (90MCG/ACT Aerosol Soln, Inhalation) Active. Brexpiprazole (1MG  Tablet, Oral) Active. Medications Reconciled  Social History  Alcohol use  Occasional alcohol use. Caffeine use  Carbonated beverages, Coffee, Tea. No drug use  Tobacco use  Never smoker.  Family History  Anesthetic complications  Mother. Colon Polyps  Mother. Depression  Father, Sister. Diabetes Mellitus  Father, Sister. Respiratory Condition  Son. Thyroid problems  Mother, Sister.  Pregnancy / Birth History Age at menarche  56 years. Contraceptive History  Oral contraceptives. Gravida  3 Length (months) of breastfeeding  3-6 Maternal age  67-30 Para  1 Regular periods   Other Problems  Asthma  Depression  Gastroesophageal Reflux Disease  General anesthesia - complications  High blood pressure  Hypercholesterolemia  Migraine Headache  Sleep Apnea  Thyroid Disease  Ventral Hernia Repair   Vitals  Weight: 224.4 lb Height: 67.5in Body Surface Area: 2.13 m Body Mass Index: 34.63 kg/m  Temp.: 97.51F (Temporal)  Pulse: 76 (Regular)  P.OX: 97% (Room air) BP: 122/80(Sitting, Left Arm, Standard)       Physical Exam  The physical exam findings are as follows: Note: Constitutional: WDWN in NAD, conversant, no obvious deformities; resting comfortably Eyes: Pupils equal, round; sclera anicteric; moist conjunctiva; no lid lag HENT: Oral mucosa moist; good dentition Neck: No masses palpated, trachea midline; no thyromegaly Lungs: CTA bilaterally; normal respiratory effort CV: Regular rate and rhythm; no murmurs; extremities well-perfused with no edema Abd: +bowel sounds, soft, non-tender, no palpable organomegaly; long midline incision - intact, no sign of infection. The underlying fascia is quite firm to palpation. There is an obvious bulge at the  umbilicus which enlarges with valsalva. I cannot clearly palpate any other hernias along the midline. Musc: Normal gait; no apparent clubbing or cyanosis in extremities Lymphatic: No palpable cervical or axillary lymphadenopathy Skin: Warm, dry; no sign of jaundice Psychiatric - alert and oriented x 4; calm mood and affect    Assessment & Plan  VENTRAL INCISIONAL HERNIA WITHOUT OBSTRUCTION  OR GANGRENE (K43.2) Current Plans Schedule for Surgery - Ventral incisional hernia repair with mesh. The surgical procedure has been discussed with the patient. Potential risks, benefits, alternative treatments, and expected outcomes have been explained. All of the patient's questions at this time have been answered. The likelihood of reaching the patient's treatment goal is good. The patient understand the proposed surgical procedure and wishes to proceed.   Note:We will excise the hernia sac and sent it for pathologic examination to determine the significance of these nondistinctive soft tissue nodules.   Imogene Burn. Georgette Dover, MD, [facility]  General/ Trauma Surgery   10/06/2020 8:30 AM

## 2020-10-06 NOTE — Anesthesia Procedure Notes (Signed)
Procedure Name: LMA Insertion Date/Time: 10/06/2020 12:47 PM Performed by: Lauralyn Primes, CRNA Pre-anesthesia Checklist: Patient identified, Emergency Drugs available, Suction available and Patient being monitored Patient Re-evaluated:Patient Re-evaluated prior to induction Oxygen Delivery Method: Circle system utilized Preoxygenation: Pre-oxygenation with 100% oxygen Induction Type: IV induction Ventilation: Mask ventilation without difficulty LMA: LMA inserted LMA Size: 4.0 Number of attempts: 1 Airway Equipment and Method: Bite block Placement Confirmation: positive ETCO2 Tube secured with: Tape Dental Injury: Teeth and Oropharynx as per pre-operative assessment

## 2020-10-06 NOTE — Anesthesia Preprocedure Evaluation (Addendum)
Anesthesia Evaluation  Patient identified by MRN, date of birth, ID band Patient awake    Reviewed: Allergy & Precautions, NPO status , Patient's Chart, lab work & pertinent test results  History of Anesthesia Complications (+) PONVNegative for: history of anesthetic complications  Airway Mallampati: III  TM Distance: >3 FB Neck ROM: Full    Dental  (+) Teeth Intact   Pulmonary asthma , sleep apnea ,    Pulmonary exam normal        Cardiovascular hypertension, Normal cardiovascular exam     Neuro/Psych negative neurological ROS  negative psych ROS   GI/Hepatic Neg liver ROS, GERD  ,  Endo/Other  Hypothyroidism   Renal/GU negative Renal ROS  negative genitourinary   Musculoskeletal negative musculoskeletal ROS (+)   Abdominal   Peds  Hematology negative hematology ROS (+)   Anesthesia Other Findings   Reproductive/Obstetrics                            Anesthesia Physical Anesthesia Plan  ASA: II  Anesthesia Plan: General   Post-op Pain Management:    Induction: Intravenous  PONV Risk Score and Plan: 4 or greater and Ondansetron, Dexamethasone, Midazolam and Treatment may vary due to age or medical condition  Airway Management Planned: LMA  Additional Equipment: None  Intra-op Plan:   Post-operative Plan: Extubation in OR  Informed Consent: I have reviewed the patients History and Physical, chart, labs and discussed the procedure including the risks, benefits and alternatives for the proposed anesthesia with the patient or authorized representative who has indicated his/her understanding and acceptance.     Dental advisory given  Plan Discussed with:   Anesthesia Plan Comments:         Anesthesia Quick Evaluation

## 2020-10-06 NOTE — Op Note (Signed)
Ventral Incisional Hernia Repair Procedure Note  Indications: Symptomatic ventral hernia - periumbilical  Pre-operative Diagnosis: Periumbilical Ventral incisional hernia  Post-operative Diagnosis: same  Surgeon: Wynona Luna   Assistants: None  Anesthesia: General endotracheal anesthesia  ASA Class: 2  Procedure Details  The patient was seen in the Holding Room. The risks, benefits, complications, treatment options, and expected outcomes were discussed with the patient. The possibilities of reaction to medication, pulmonary aspiration, perforation of viscus, bleeding, recurrent infection, the need for additional procedures, failure to diagnose a condition, and creating a complication requiring transfusion or operation were discussed with the patient. The patient concurred with the proposed plan, giving informed consent.  The site of surgery properly noted/marked. The patient was taken to the operating room, identified as [facility] and the procedure verified as ventral hernia repair. A Time Out was held and the above information confirmed.  The patient was placed supine.  After establishing general anesthesia, the abdomen was prepped with Chloraprep and draped in sterile fashion.  We made a transverse incision below the palpable hernia. Dissection was carried down to the hernia sac located above the fascia and mobilized from surrounding structures.  We extended the incision superiorly.  Intact fascia was identified circumferentially around the defect.  We used a small ventralex mesh and secured this to the fascia with interrupted 0 Novofil sutures.  The fascial defect was reapproximated with interrupted figure-of-8 1 Novofil sutures.  The subcutaneous tissues were irrigated.  The skin incision was closed with a 3-0 Vicryl subcutaneous closure.  Hemostasis was confirmed.  The skin incision was closed in layers with a 4-0 Vicryl subcuticular closure.  Steri-Strips were applied at the end  of the operation.    Instrument, sponge, and needle counts were correct prior to closure and at the conclusion of the case.   Findings: 1.5 cm defect near umbilicus  Estimated Blood Loss:  Minimal         Drains: none                      Complications:  None; patient tolerated the procedure well.         Disposition: PACU - hemodynamically stable.         Condition: stable  Wilmon Arms. Corliss Skains, MD, [facility]  General/ Trauma Surgery   10/06/2020 1:41 PM

## 2020-10-06 NOTE — Anesthesia Postprocedure Evaluation (Signed)
Anesthesia Post Note  Patient: Natalie Camacho [facility]  Procedure(s) Performed: VENTRAL INCISIONAL HERNIA REPAIR WITH MESH (N/A Abdomen)     Patient location during evaluation: PACU Anesthesia Type: General Level of consciousness: awake and alert Pain management: pain level controlled Vital Signs Assessment: post-procedure vital signs reviewed and stable Respiratory status: spontaneous breathing, nonlabored ventilation and respiratory function stable Cardiovascular status: blood pressure returned to baseline and stable Postop Assessment: no apparent nausea or vomiting Anesthetic complications: no   No complications documented.  Last Vitals:  Vitals:   10/06/20 1411 10/06/20 1441  BP: 122/68 117/62  Pulse: 70 71  Resp: 11 14  Temp:  (!) 36.2 C  SpO2: 96% 97%    Last Pain:  Vitals:   10/06/20 1426  TempSrc:   PainSc: 6                  Lucretia Kern

## 2020-10-06 NOTE — Discharge Instructions (Signed)
You had 5mg  of Oxycodone at 2:30PM. Next dose of Tylenol can be given after 5:06PM.    [facility] _______[facility]  UMBILICAL OR INGUINAL HERNIA REPAIR: POST OP INSTRUCTIONS  Always review your discharge instruction sheet given to you by the facility where your surgery was performed. IF YOU HAVE DISABILITY OR FAMILY LEAVE FORMS, YOU MUST BRING THEM TO THE OFFICE FOR PROCESSING.   DO NOT GIVE THEM TO YOUR DOCTOR.  1. A  prescription for pain medication may be given to you upon discharge.  Take your pain medication as prescribed, if needed.  If narcotic pain medicine is not needed, then you may take acetaminophen (Tylenol) or ibuprofen (Advil) as needed. 2. Take your usually prescribed medications unless otherwise directed. If you need a refill on your pain medication, please contact your pharmacy.  They will contact our office to request authorization. Prescriptions will not be filled after 5 pm or on week-ends. 3. You should follow a light diet the first 24 hours after arrival home, such as soup and crackers, etc.  Be sure to include lots of fluids daily.  Resume your normal diet the day after surgery. 4.Most patients will experience some swelling and bruising around the umbilicus or in the groin and scrotum.  Ice packs and reclining will help.  Swelling and bruising can take several days to resolve.  6. It is common to experience some constipation if taking pain medication after surgery.  Increasing fluid intake and taking a stool softener (such as Colace) will usually help or prevent this problem from occurring.  A mild laxative (Milk of Magnesia or Miralax) should be taken according to package directions if there are no bowel movements after 48 hours. 7. Unless discharge instructions indicate otherwise, you may remove your bandages 24-48 hours after surgery, and you may shower at that time.  You may have steri-strips (small skin tapes) in place directly over the incision.  These  strips should be left on the skin for 7-10 days.  If your surgeon used skin glue on the incision, you may shower in 24 hours.  The glue will flake off over the next 2-3 weeks.  Any sutures or staples will be removed at the office during your follow-up visit. 8. ACTIVITIES:  You may resume regular (light) daily activities beginning the next day--such as daily self-care, walking, climbing stairs--gradually increasing activities as tolerated.  You may have sexual intercourse when it is comfortable.  Refrain from any heavy lifting or straining until approved by your doctor.  a.You may drive when you are no longer taking prescription pain medication, you can comfortably wear a seatbelt, and you can safely maneuver your car and apply brakes. b.RETURN TO WORK:   _____________________________________________  9.You should see your doctor in the office for a follow-up appointment approximately 2-3 weeks after your surgery.  Make sure that you call for this appointment within a day or two after you arrive home to insure a convenient appointment time. 10.OTHER INSTRUCTIONS: _________________________    _____________________________________  WHEN TO CALL YOUR DOCTOR: 1. Fever over 101.0 2. Inability to urinate 3. Nausea and/or vomiting 4. Extreme swelling or bruising 5. Continued bleeding from incision. 6. Increased pain, redness, or drainage from the incision  The clinic staff is available to answer your questions during regular business hours.  Please don't hesitate to call and ask to speak to one of the nurses for clinical concerns.  If you have a medical emergency, go to the nearest emergency room  or call 911.  A surgeon from [facility] is always on call at the hospital   [address], [place], [place]  [address] ?  [address] [address], [place], [place]   [address] [phone] ? [phone] ? FAX [phone] Web site: [email or web address]     Post  Anesthesia Home Care Instructions  Activity: Get plenty of rest for the remainder of the day. A responsible individual must stay with you for 24 hours following the procedure.  For the next 24 hours, DO NOT: -Drive a car -Advertising copywriter -Drink alcoholic beverages -Take any medication unless instructed by your physician -Make any legal decisions or sign important papers.  Meals: Start with liquid foods such as gelatin or soup. Progress to regular foods as tolerated. Avoid greasy, spicy, heavy foods. If nausea and/or vomiting occur, drink only clear liquids until the nausea and/or vomiting subsides. Call your physician if vomiting continues.  Special Instructions/Symptoms: Your throat may feel dry or sore from the anesthesia or the breathing tube placed in your throat during surgery. If this causes discomfort, gargle with warm salt water. The discomfort should disappear within 24 hours.  If you had a scopolamine patch placed behind your ear for the management of post- operative nausea and/or vomiting:  1. The medication in the patch is effective for 72 hours, after which it should be removed.  Wrap patch in a tissue and discard in the trash. Wash hands thoroughly with soap and water. 2. You may remove the patch earlier than 72 hours if you experience unpleasant side effects which may include dry mouth, dizziness or visual disturbances. 3. Avoid touching the patch. Wash your hands with soap and water after contact with the patch.     Post Anesthesia Home Care Instructions  Activity: Get plenty of rest for the remainder of the day. A responsible individual must stay with you for 24 hours following the procedure.  For the next 24 hours, DO NOT: -Drive a car -Advertising copywriter -Drink alcoholic beverages -Take any medication unless instructed by your physician -Make any legal decisions or sign important papers.  Meals: Start with liquid foods such as gelatin or soup. Progress to  regular foods as tolerated. Avoid greasy, spicy, heavy foods. If nausea and/or vomiting occur, drink only clear liquids until the nausea and/or vomiting subsides. Call your physician if vomiting continues.  Special Instructions/Symptoms: Your throat may feel dry or sore from the anesthesia or the breathing tube placed in your throat during surgery. If this causes discomfort, gargle with warm salt water. The discomfort should disappear within 24 hours.  If you had a scopolamine patch placed behind your ear for the management of post- operative nausea and/or vomiting:  1. The medication in the patch is effective for 72 hours, after which it should be removed.  Wrap patch in a tissue and discard in the trash. Wash hands thoroughly with soap and water. 2. You may remove the patch earlier than 72 hours if you experience unpleasant side effects which may include dry mouth, dizziness or visual disturbances. 3. Avoid touching the patch. Wash your hands with soap and water after contact with the patch.

## 2020-10-07 ENCOUNTER — Encounter (HOSPITAL_BASED_OUTPATIENT_CLINIC_OR_DEPARTMENT_OTHER): Payer: Self-pay | Admitting: Surgery

## 2020-10-07 LAB — SURGICAL PATHOLOGY

## 2020-11-18 ENCOUNTER — Encounter: Payer: Self-pay | Admitting: Internal Medicine

## 2020-12-31 ENCOUNTER — Other Ambulatory Visit (INDEPENDENT_AMBULATORY_CARE_PROVIDER_SITE_OTHER): Payer: 59

## 2020-12-31 ENCOUNTER — Other Ambulatory Visit: Payer: Self-pay

## 2020-12-31 DIAGNOSIS — E039 Hypothyroidism, unspecified: Secondary | ICD-10-CM | POA: Diagnosis not present

## 2020-12-31 LAB — TSH: TSH: 7.22 u[IU]/mL — ABNORMAL HIGH (ref 0.35–4.50)

## 2020-12-31 LAB — T4, FREE: Free T4: 1.09 ng/dL (ref 0.60–1.60)

## 2021-03-07 ENCOUNTER — Other Ambulatory Visit: Payer: Self-pay

## 2021-03-07 ENCOUNTER — Encounter: Payer: Self-pay | Admitting: Internal Medicine

## 2021-03-07 ENCOUNTER — Ambulatory Visit (INDEPENDENT_AMBULATORY_CARE_PROVIDER_SITE_OTHER): Payer: 59 | Admitting: Internal Medicine

## 2021-03-07 VITALS — BP 128/82 | HR 88 | Ht 67.5 in | Wt 246.4 lb

## 2021-03-07 DIAGNOSIS — E041 Nontoxic single thyroid nodule: Secondary | ICD-10-CM

## 2021-03-07 DIAGNOSIS — E039 Hypothyroidism, unspecified: Secondary | ICD-10-CM

## 2021-03-07 LAB — T4, FREE: Free T4: 0.92 ng/dL (ref 0.60–1.60)

## 2021-03-07 LAB — TSH: TSH: 14.84 u[IU]/mL — ABNORMAL HIGH (ref 0.35–4.50)

## 2021-03-07 NOTE — Progress Notes (Signed)
Patient ID: Natalie Camacho, female   DOB: December 22, 1965, 55 y.o.   MRN: [id number]   This visit occurred during the SARS-CoV-2 public health emergency.  Safety protocols were in place, including screening questions prior to the visit, additional usage of staff PPE, and extensive cleaning of exam room while observing appropriate contact time as indicated for disinfecting solutions.   HPI  Natalie Camacho is a 55 y.o.-year-old female, presenting for follow-up for hypothyroidism and thyroid cyst.  Last visit 1 year ago.  Interim history: Before last office visit, she lost ~30 lbs - cut out sodas, sugary treats, starches. She gained back 15 lbs.  She was on a cruise in 11/2020. She tore her L knee meniscus >> had to have surgery.  Reviewed history: Pt. has been dx with goiter and hypothyroidism in 1992 >> on high doses of levothyroxine, dose increased to 225 mcg daily in 07/2019.  TSH normalized on this dose in 09/2019.  In 07/2019, LT4 dose was increased to 225 mcg daily  In 09/2019, TSH was normal on this dose  In 04/2020, TSH was suppressed, so we decreased LT4 to 200 mcg daily  In 07/2020, TSH is very high but we did not change the dose suspecting noncompliance.  In 09/2020, TSH was normal.  In 11/2020, a TSH checked bu PCP was higher, at 8.39.  In 12/2020, TSH was again slightly high, and I advised her to start crushing the LT4 200 mcg tablet  Patient takes her Synthroid: - in am (around 5:30 AM) - fasting - at least 30 min from b'fast, usually after 6:30 am - no Ca, Fe - + PPIs on and off - Omeprazole (Nexium) - recently - takes this >2h later - + Multivitamins at night - not on Biotin  Reviewed her TFTs: Lab Results  Component Value Date   TSH 7.22 (H) 12/31/2020   TSH 3.95 09/16/2020   TSH 20.87 (H) 07/27/2020   TSH 0.28 (L) 04/21/2020   TSH 0.18 (L) 03/08/2020   TSH 2.89 09/30/2019   TSH 8.10 (H) 07/09/2019   TSH 0.49 07/23/2018   TSH 1.95 01/21/2018   TSH 1.19  11/13/2017   FREET4 1.09 12/31/2020   FREET4 0.91 09/16/2020   FREET4 0.74 07/27/2020   FREET4 1.14 04/21/2020   FREET4 1.48 03/08/2020   FREET4 1.09 09/30/2019   FREET4 1.07 07/09/2019   FREET4 1.04 07/23/2018   FREET4 0.91 01/21/2018   FREET4 0.97 11/13/2017   11/11/2020: TSH 8.39 05/2019: Reported TSH 11.58. 05/06/2018: TSH 0.57 10/08/2017: TSH 0.22 -she was on biotin at the time 08/10/2017: TSH 0.85, free T4 1.03 07/09/2017: TSH 8.70, free T4 0.76 06/14/2017: TSH 5.51, free T4, 0.65 12/26/2016: TSH 3.08, free T4 0.8   Thyroid ultrasound 06/29/2017: 0.9 cm small cystic nodule in the right inferior thyroid lobe  Thyroid ultrasound (03/16/2020): Parenchymal Echotexture: Choose 4 Isthmus: 3 mm Right lobe: 3.6 x 1.2 x 1.5 cm Left lobe: 4.7 x 1.1 x 1.2 cm _________________________________________________________  Nodule # 1: Location: Right; Mid Maximum size: 1.7 cm; Other 2 dimensions: 1.2 x 1.2 cm Composition: solid/almost completely solid (2) Echogenicity: hypoechoic (2) Echogenic foci: macrocalcifications (1)  **Given size (>/= 1.5 cm) and appearance, fine needle aspiration of this moderately suspicious nodule should be considered based on TI-RADS criteria. _____________________________________________________  No significant left thyroid abnormality or nodule. No hypervascularity or regional adenopathy.  IMPRESSION: 1.7 cm right mid thyroid TR 4 nodule meets criteria for biopsy as above.  FNA (03/18/2020): Atypia of unknown  significance Afirma molecular marker: Benign  Pt mentions: - no feeling nodules in neck - no hoarseness + occasional dysphagia + occasional choking - no SOB with lying down, but occasionally with turning head, raising arms She has OSA-on CPAP.  She has + FH of thyroid disorders in: hypothyroidism in mother and sister. No FH of thyroid cancer. No h/o radiation tx to head or neck.  No herbal supplements. No Biotin use. No recent steroids  use.   Pt. also has a history of GERD, iron deficiency anemia, asthma (on Albuterol).   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   She has prediabetes. HbA1c from 11/10/2019 was 6.3%. 11/11/2020: was 5.7%. No results found for: HGBA1C She has menopausal hot flashes>> better on estradiol. She has back pain and gets nerve blocks every 3-4 months through the pain clinic. Last injection last month.  ROS: Constitutional: + weight gain, no fatigue, no subjective hyperthermia, no subjective hypothermia Eyes: no blurry vision, no xerophthalmia ENT: no sore throat, + see HPI Cardiovascular: no CP/no SOB/no palpitations/no leg swelling Respiratory: no cough/no SOB/no wheezing Gastrointestinal: no N/no V/no D/no C/no acid reflux Musculoskeletal: no muscle aches/+ joint aches Skin: no rashes, no hair loss Neurological: no tremors/no numbness/no tingling/no dizziness  I reviewed pt's medications, allergies, PMH, social hx, family hx, and changes were documented in the history of present illness. Otherwise, unchanged from my initial visit note.  Past Medical History:  Diagnosis Date  . Anemia   . Anxiety   . Asthma   . Complex regional pain syndrome type II   . Depression   . GERD (gastroesophageal reflux disease)   . Headache(784.0)   . Heart murmur   . Hypertension   . Hypothyroidism   . PONV (postoperative nausea and vomiting)   . Sleep apnea    Past Surgical History:  Procedure Laterality Date  . ABDOMINOPLASTY    . CARPAL TUNNEL RELEASE    . FRACTURE SURGERY     ORIF rt ankle, tendon sheath removed  . fundal     fundal plication  . HERNIA REPAIR  2001  . NOSE SURGERY    . RECTOCELE REPAIR    . VENTRAL HERNIA REPAIR N/A 10/06/2020   Procedure: VENTRAL INCISIONAL HERNIA REPAIR WITH MESH;  Surgeon: Donnie Mesa, MD;  Location: [facility];  Service: General;  Laterality: N/A;  LMA   Social History   Socioeconomic History  . Marital status: Married     Spouse name: Not on file  . Number of children: 1  Social Needs  Occupational History  .  Retired  Tobacco Use  . Smoking status: Never Smoker  . Smokeless tobacco: Never Used  Substance and Sexual Activity  . Alcohol use:  Liquor 3 times a year, 1-2 drinks  . Drug use: No   Current Outpatient Medications on File Prior to Visit  Medication Sig Dispense Refill  . acetaminophen (TYLENOL) 500 MG tablet Take 1,000 mg by mouth every 6 (six) hours as needed for headache (pain).    Marland Kitchen albuterol (VENTOLIN HFA) 108 (90 Base) MCG/ACT inhaler Inhale 2 puffs into the lungs every 6 (six) hours as needed for wheezing or shortness of breath.    . brexpiprazole (REXULTI) 1 MG TABS tablet Take 1 mg by mouth daily.    Marland Kitchen buPROPion (WELLBUTRIN XL) 300 MG 24 hr tablet Take 300 mg by mouth daily.    . Cholecalciferol (VITAMIN D-3) 5000 units TABS Take 5,000 Units by mouth daily.    Marland Kitchen  DULoxetine (CYMBALTA) 60 MG capsule Take 120 mg by mouth daily.    . Estradiol-Norethindrone Acet 0.5-0.1 MG tablet Take 1 tablet by mouth daily.    . hydrochlorothiazide (HYDRODIURIL) 25 MG tablet Take 25 mg by mouth daily.    Marland Kitchen HYDROcodone-acetaminophen (NORCO/VICODIN) 5-325 MG tablet Take 1 tablet by mouth every 6 (six) hours as needed for moderate pain. 15 tablet 0  . hydroxypropyl methylcellulose / hypromellose (ISOPTO TEARS / GONIOVISC) 2.5 % ophthalmic solution Place 1 drop into the left eye 4 (four) times daily as needed for dry eyes. 15 mL 12  . ibuprofen (ADVIL,MOTRIN) 200 MG tablet Take 400-600 mg by mouth every 6 (six) hours as needed for headache (pain).    Marland Kitchen loratadine (CLARITIN) 10 MG tablet Take 10 mg by mouth daily as needed (pet allergies).    . modafinil (PROVIGIL) 200 MG tablet Take 200 mg by mouth 2 (two) times daily. 1 in the morning and  Half in the afternoon.    . Multiple Vitamin (MULTIVITAMIN WITH MINERALS) TABS tablet Take 1 tablet by mouth at bedtime.    Marland Kitchen NITROFURANTOIN PO Take 100 mg by mouth daily.     Vladimir Faster Glycol-Propyl Glycol (SYSTANE OP) Place 1 drop into both eyes daily as needed (dry eyes/ irritation).    . potassium chloride SA (K-DUR,KLOR-CON) 20 MEQ tablet Take 20 mEq by mouth daily.    Marland Kitchen PRESCRIPTION MEDICATION Inhale into the lungs at bedtime. CPAP    . rOPINIRole (REQUIP) 3 MG tablet Take 3 mg by mouth at bedtime.    . simvastatin (ZOCOR) 20 MG tablet Take 20 mg by mouth at bedtime.    Marland Kitchen SYNTHROID 200 MCG tablet TAKE 1 TABLET BY MOUTH  BEFORE BREAKFAST 45 tablet 3  . topiramate (TOPAMAX) 100 MG tablet Take 200 mg by mouth 2 (two) times daily.     . valACYclovir (VALTREX) 1000 MG tablet Take 1 tablet (1,000 mg total) by mouth 3 (three) times daily. 21 tablet 0  . zolpidem (AMBIEN) 10 MG tablet Take 10 mg by mouth at bedtime.     No current facility-administered medications on file prior to visit.   Allergies  Allergen Reactions  . Accolate [Zafirlukast] Hives  . Aspirin Other (See Comments)    wheezing  . Augmentin [Amoxicillin-Pot Clavulanate] Hives and Nausea And Vomiting  . Codeine Nausea And Vomiting  . Erythromycin Nausea And Vomiting  . Lyrica [Pregabalin] Other (See Comments)    Suicidal thoughts   . Peanut-Containing Drug Products Other (See Comments)    Wheezing    Family History  Problem Relation Age of Onset  . Hypothyroidism Mother   . Diabetes Father   Also,  heart disease in grandmother  diabetes in sister Lung cancer in grandfather  PE: BP 128/82 (BP Location: Right Arm, Patient Position: Sitting, Cuff Size: Normal)   Pulse 88   Ht 5' 7.5" (1.715 m)   Wt 246 lb 6.4 oz (111.8 kg)   SpO2 98%   BMI 38.02 kg/m  Wt Readings from Last 3 Encounters:  03/07/21 246 lb 6.4 oz (111.8 kg)  10/06/20 231 lb 7.7 oz (105 kg)  03/08/20 236 lb (107 kg)   Constitutional: overweight, in NAD, + full supraclavicular fat pads Eyes: PERRLA, EOMI, no exophthalmos ENT: moist mucous membranes, no thyromegaly, no cervical lymphadenopathy Cardiovascular: RRR,  No MRG Respiratory: CTA B Gastrointestinal: abdomen soft, NT, ND, BS+ Musculoskeletal: no deformities, strength intact in all 4 Skin: moist, warm, no rashes Neurological: no tremor  with outstretched hands, DTR normal in all 4  ASSESSMENT: 1. Hypothyroidism due to Hashimoto's thyroiditis.  2.  Thyroid nodule  3.  Prediabetes  PLAN:  1. Patient with longstanding hypothyroidism, on levothyroxine therapy. She has Hashimoto thyroiditis based on high TPO antibodies.  - latest thyroid labs reviewed with pt. >> elevated: Lab Results  Component Value Date   TSH 7.22 (H) 12/31/2020  - she continues on LT4 200 mcg daily -after the above results returned, I advised her to chew the tablet for better absorption.  She is doing this now. - pt feels good on this dose.  Since last visit, she only lost 5 pounds, but before the previous visit, she lost almost 26.  However, at this visit, she gained 15 pounds back. - we discussed about taking the thyroid hormone every day, with water, >30 minutes before breakfast, separated by >4 hours from acid reflux medications, calcium, iron, multivitamins. Pt. is not taking this correctly-she started PPI 2 hours later. - will check thyroid tests today: TSH and fT4 - If labs are abnormal, she will need to return for repeat TFTs in 1.5 months - Otherwise, I will see her back in a year  2.  Thyroid nodule -Reviewed together latest thyroid ultrasound from 03/16/2020.  This showed a 1.7 cm right mid nodule, possibly developed from a previous cyst. -She had FNA of this lesion on 03/18/2020 and this was inconclusive (AUS) -However, the sample was sent for Afirma molecular marker and this returned benign, which has a very good predictive value for benignity. -At last visit, she mentioned occasional dysphagia, pressure, and shortness of breath when turning head and raising arms. -At this visit, symptoms improved but she still has some pressure in her neck, also possibly due to  fluid retention -We will continue to follow her clinically and we will check another ultrasound in 2 years after the previous  3.  Prediabetes -Latest HbA1c was reviewed from last visit with PCP on 11/11/2020: 5.7%, decreased from 6.3%. -Discussed that weight gain is a risk factor for prediabetes worsening and also the risk of developing diabetes -No need to start medication for now, but I encouraged her to try to lose weight  Component     Latest Ref Rng & Units 03/07/2021  T4,Free(Direct)     0.60 - 1.60 ng/dL 0.92  TSH     0.35 - 4.50 uIU/mL 14.84 (H)  TSH is even higher.  I suspect that this is due to her PPI close to levothyroxine.  I will advise her to move omeprazole with lunch.  We will also add back the 25 mcg levothyroxine for a total of 225 mcg daily and recheck her TFTs in 1.5 months.  [name], MD PhD [facility] Endocrinology

## 2021-03-07 NOTE — Patient Instructions (Addendum)
Please continue Synthroid  200 mcg daily.  Take the thyroid hormone every day, with water, at least 30 minutes before breakfast, separated by at least 4 hours from: - acid reflux medications - calcium - iron - multivitamins  Please stop at the lab.  Please return in 6 months.

## 2021-03-08 MED ORDER — SYNTHROID 25 MCG PO TABS: 25.0000 ug | ORAL_TABLET | Freq: Every day | ORAL | 3 refills | Status: DC

## 2021-05-02 ENCOUNTER — Other Ambulatory Visit: Payer: Self-pay

## 2021-05-02 ENCOUNTER — Other Ambulatory Visit (INDEPENDENT_AMBULATORY_CARE_PROVIDER_SITE_OTHER): Payer: 59

## 2021-05-02 DIAGNOSIS — E039 Hypothyroidism, unspecified: Secondary | ICD-10-CM | POA: Diagnosis not present

## 2021-05-02 LAB — T4, FREE: Free T4: 0.94 ng/dL (ref 0.60–1.60)

## 2021-05-02 LAB — TSH: TSH: 3.81 u[IU]/mL (ref 0.35–5.50)

## 2021-05-23 ENCOUNTER — Other Ambulatory Visit: Payer: Self-pay | Admitting: Internal Medicine

## 2021-05-23 DIAGNOSIS — E039 Hypothyroidism, unspecified: Secondary | ICD-10-CM

## 2021-05-23 MED ORDER — SYNTHROID 25 MCG PO TABS: 25.0000 ug | ORAL_TABLET | Freq: Every day | ORAL | 3 refills | Status: DC

## 2021-08-24 ENCOUNTER — Other Ambulatory Visit: Payer: Self-pay | Admitting: Obstetrics and Gynecology

## 2021-08-29 ENCOUNTER — Other Ambulatory Visit: Payer: Self-pay | Admitting: Obstetrics and Gynecology

## 2021-08-29 DIAGNOSIS — N644 Mastodynia: Secondary | ICD-10-CM

## 2021-09-08 ENCOUNTER — Other Ambulatory Visit: Payer: Self-pay

## 2021-09-08 ENCOUNTER — Ambulatory Visit (INDEPENDENT_AMBULATORY_CARE_PROVIDER_SITE_OTHER): Payer: 59 | Admitting: Internal Medicine

## 2021-09-08 ENCOUNTER — Encounter: Payer: Self-pay | Admitting: Internal Medicine

## 2021-09-08 VITALS — BP 130/70 | HR 71 | Ht 67.5 in | Wt 245.8 lb

## 2021-09-08 DIAGNOSIS — E039 Hypothyroidism, unspecified: Secondary | ICD-10-CM | POA: Diagnosis not present

## 2021-09-08 DIAGNOSIS — R7303 Prediabetes: Secondary | ICD-10-CM

## 2021-09-08 DIAGNOSIS — E041 Nontoxic single thyroid nodule: Secondary | ICD-10-CM | POA: Diagnosis not present

## 2021-09-08 LAB — TSH: TSH: 5.01 u[IU]/mL (ref 0.35–5.50)

## 2021-09-08 LAB — T4, FREE: Free T4: 0.86 ng/dL (ref 0.60–1.60)

## 2021-09-08 LAB — HEMOGLOBIN A1C: Hgb A1c MFr Bld: 5.9 % (ref 4.6–6.5)

## 2021-09-08 NOTE — Patient Instructions (Signed)
Please continue Synthroid  225 mcg daily.  Take the thyroid hormone every day, with water, at least 30 minutes before breakfast, separated by at least 4 hours from: - acid reflux medications - calcium - iron - multivitamins  Please stop at the lab.  Please return in 6 months.

## 2021-09-08 NOTE — Progress Notes (Signed)
Patient ID: Natalie Camacho, female   DOB: [birth year], 55 y.o.   MRN: [id number]   This visit occurred during the SARS-CoV-2 public health emergency.  Safety protocols were in place, including screening questions prior to the visit, additional usage of staff PPE, and extensive cleaning of exam room while observing appropriate contact time as indicated for disinfecting solutions.   HPI  Natalie Camacho is a 55 y.o.-year-old female, presenting for follow-up for hypothyroidism and thyroid cyst.  Last visit 6 months ago.  Interim history: Last year, she lost ~30 lbs - cut out sodas, sugary treats, starches.  Now trying to stay on this diet. Now working with a nutritionist. Now reducing calories. She gained back 15 pounds before last visit.  Weight stable now. She had cataract surgery in 07/2021.  Reviewed and addended history: Pt. has been dx with goiter and hypothyroidism in 1992 >> on high doses of levothyroxine, dose increased to 225 mcg daily in 07/2019.  TSH normalized on this dose in 09/2019.  In 07/2019, LT4 dose was increased to 225 mcg daily  In 09/2019, TSH was normal on this dose  In 04/2020, TSH was suppressed, so we decreased LT4 to 200 mcg daily  In 07/2020, TSH is very high but we did not change the dose suspecting noncompliance.  In 09/2020, TSH was normal.  In 11/2020, a TSH checked by PCP was higher, at 8.39.  In 12/2020, TSH was again slightly high, and I advised her to start crushing the LT4 200 mcg tablet  In 03/2021, we moved omeprazole later in the day (stopped since) and increased the dose of  LT4 to 225 mcg daily and stopped crushing the tab.  Patient takes her Synthroid DAW: - no missed doses! - in am (around 5:30 AM) - fasting - at least 30 min from b'fast, usually after 6:30 am - no Ca, Fe - Omeprazole (Nexium) >> stopped - + Multivitamins at night - not on Biotin  Reviewed her TFTs: Lab Results  Component Value Date   TSH 3.81 05/02/2021   TSH  14.84 (H) 03/07/2021   TSH 7.22 (H) 12/31/2020   TSH 3.95 09/16/2020   TSH 20.87 (H) 07/27/2020   TSH 0.28 (L) 04/21/2020   TSH 0.18 (L) 03/08/2020   TSH 2.89 09/30/2019   TSH 8.10 (H) 07/09/2019   TSH 0.49 07/23/2018   FREET4 0.94 05/02/2021   FREET4 0.92 03/07/2021   FREET4 1.09 12/31/2020   FREET4 0.91 09/16/2020   FREET4 0.74 07/27/2020   FREET4 1.14 04/21/2020   FREET4 1.48 03/08/2020   FREET4 1.09 09/30/2019   FREET4 1.07 07/09/2019   FREET4 1.04 07/23/2018   11/11/2020: TSH 8.39 05/2019: Reported TSH 11.58. 05/06/2018: TSH 0.57 10/08/2017: TSH 0.22 -she was on biotin at the time 08/10/2017: TSH 0.85, free T4 1.03 07/09/2017: TSH 8.70, free T4 0.76 06/14/2017: TSH 5.51, free T4, 0.65 12/26/2016: TSH 3.08, free T4 0.8   Thyroid ultrasound 06/29/2017: 0.9 cm small cystic nodule in the right inferior thyroid lobe  Thyroid ultrasound (03/16/2020): Parenchymal Echotexture: Choose 4 Isthmus: 3 mm Right lobe: 3.6 x 1.2 x 1.5 cm Left lobe: 4.7 x 1.1 x 1.2 cm _________________________________________________________   Nodule # 1: Location: Right; Mid Maximum size: 1.7 cm; Other 2 dimensions: 1.2 x 1.2 cm Composition: solid/almost completely solid (2) Echogenicity: hypoechoic (2) Echogenic foci: macrocalcifications (1)  **Given size (>/= 1.5 cm) and appearance, fine needle aspiration of this moderately suspicious nodule should be considered based on TI-RADS criteria. _____________________________________________________  No significant left thyroid abnormality or nodule. No hypervascularity or regional adenopathy.   IMPRESSION: 1.7 cm right mid thyroid TR 4 nodule meets criteria for biopsy as above.  FNA (03/18/2020): Atypia of unknown significance Afirma molecular marker: Benign  Pt mentions: - no feeling nodules in neck - no hoarseness + occasional dysphagia + occasional choking - no SOB with lying down, but occasionally with turning head, raising arms She has  OSA-on CPAP.  She has + FH of thyroid disorders in: hypothyroidism in mother and sister. No FH of thyroid cancer. No h/o radiation tx to head or neck.  No herbal supplements. No Biotin use. No recent steroids use.   She has prediabetes. HbA1c from 11/10/2019 was 6.3%. 11/11/2020: was 5.7%. No results found for: HGBA1C  She has menopausal hot flashes>> better on estradiol. She has back pain and gets nerve blocks every 3-4 months through the pain clinic. Last injection last month. Pt. also has a history of GERD, iron deficiency anemia, asthma (on Albuterol).   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    ROS: + See HPI  I reviewed pt's medications, allergies, PMH, social hx, family hx, and changes were documented in the history of present illness. Otherwise, unchanged from my initial visit note.  Past Medical History:  Diagnosis Date   Anemia    Anxiety    Asthma    Complex regional pain syndrome type II    Depression    GERD (gastroesophageal reflux disease)    Headache(784.0)    Heart murmur    Hypertension    Hypothyroidism    PONV (postoperative nausea and vomiting)    Sleep apnea    Past Surgical History:  Procedure Laterality Date   ABDOMINOPLASTY     CARPAL TUNNEL RELEASE     FRACTURE SURGERY     ORIF rt ankle, tendon sheath removed   fundal     fundal plication   HERNIA REPAIR  2001   NOSE SURGERY     RECTOCELE REPAIR     VENTRAL HERNIA REPAIR N/A 10/06/2020   Procedure: VENTRAL INCISIONAL HERNIA REPAIR WITH MESH;  Surgeon: Donnie Mesa, MD;  Location: [facility];  Service: General;  Laterality: N/A;  LMA   Social History   Socioeconomic History   Marital status: Married    Spouse name: Not on file   Number of children: 1  Social Needs  Occupational History    Retired  Tobacco Use   Smoking status: Never Smoker   Smokeless tobacco: Never Used  Substance and Sexual Activity   Alcohol use:  Liquor 3 times a year, 1-2 drinks   Drug  use: No   Current Outpatient Medications on File Prior to Visit  Medication Sig Dispense Refill   acetaminophen (TYLENOL) 500 MG tablet Take 1,000 mg by mouth every 6 (six) hours as needed for headache (pain).     albuterol (VENTOLIN HFA) 108 (90 Base) MCG/ACT inhaler Inhale 2 puffs into the lungs every 6 (six) hours as needed for wheezing or shortness of breath.     brexpiprazole (REXULTI) 1 MG TABS tablet Take 1 mg by mouth daily.     buPROPion (WELLBUTRIN XL) 300 MG 24 hr tablet Take 300 mg by mouth daily.     Cholecalciferol (VITAMIN D-3) 5000 units TABS Take 5,000 Units by mouth daily.     DULoxetine (CYMBALTA) 60 MG capsule Take 120 mg by mouth daily.     Estradiol-Norethindrone Acet 0.5-0.1 MG tablet  Take 1 tablet by mouth daily.     hydrochlorothiazide (HYDRODIURIL) 25 MG tablet Take 25 mg by mouth daily.     HYDROcodone-acetaminophen (NORCO/VICODIN) 5-325 MG tablet Take 1 tablet by mouth every 6 (six) hours as needed for moderate pain. 15 tablet 0   hydroxypropyl methylcellulose / hypromellose (ISOPTO TEARS / GONIOVISC) 2.5 % ophthalmic solution Place 1 drop into the left eye 4 (four) times daily as needed for dry eyes. 15 mL 12   ibuprofen (ADVIL,MOTRIN) 200 MG tablet Take 400-600 mg by mouth every 6 (six) hours as needed for headache (pain).     loratadine (CLARITIN) 10 MG tablet Take 10 mg by mouth daily as needed (pet allergies).     modafinil (PROVIGIL) 200 MG tablet Take 200 mg by mouth 2 (two) times daily. 1 in the morning and  Half in the afternoon.     Multiple Vitamin (MULTIVITAMIN WITH MINERALS) TABS tablet Take 1 tablet by mouth at bedtime.     NITROFURANTOIN PO Take 100 mg by mouth daily.     Polyethyl Glycol-Propyl Glycol (SYSTANE OP) Place 1 drop into both eyes daily as needed (dry eyes/ irritation).     potassium chloride SA (K-DUR,KLOR-CON) 20 MEQ tablet Take 20 mEq by mouth daily.     PRESCRIPTION MEDICATION Inhale into the lungs at bedtime. CPAP     rOPINIRole  (REQUIP) 3 MG tablet Take 3 mg by mouth at bedtime.     simvastatin (ZOCOR) 20 MG tablet Take 20 mg by mouth at bedtime.     SYNTHROID 200 MCG tablet TAKE 1 TABLET BY MOUTH  BEFORE BREAKFAST 90 tablet 3   SYNTHROID 25 MCG tablet Take 1 tablet (25 mcg total) by mouth daily before breakfast. 90 tablet 3   topiramate (TOPAMAX) 100 MG tablet Take 200 mg by mouth 2 (two) times daily.      valACYclovir (VALTREX) 1000 MG tablet Take 1 tablet (1,000 mg total) by mouth 3 (three) times daily. 21 tablet 0   zolpidem (AMBIEN) 10 MG tablet Take 10 mg by mouth at bedtime.     No current facility-administered medications on file prior to visit.   Allergies  Allergen Reactions   Accolate [Zafirlukast] Hives   Aspirin Other (See Comments)    wheezing   Augmentin [Amoxicillin-Pot Clavulanate] Hives and Nausea And Vomiting   Codeine Nausea And Vomiting   Erythromycin Nausea And Vomiting   Lyrica [Pregabalin] Other (See Comments)    Suicidal thoughts    Peanut-Containing Drug Products Other (See Comments)    Wheezing    Family History  Problem Relation Age of Onset   Hypothyroidism Mother    Diabetes Father   Also,  heart disease in grandmother  diabetes in sister Lung cancer in grandfather  PE: BP 130/70 (BP Location: Right Arm, Patient Position: Sitting, Cuff Size: Normal)   Pulse 71   Ht 5' 7.5" (1.715 m)   Wt 245 lb 12.8 oz (111.5 kg)   SpO2 96%   BMI 37.93 kg/m  Wt Readings from Last 3 Encounters:  09/08/21 245 lb 12.8 oz (111.5 kg)  03/07/21 246 lb 6.4 oz (111.8 kg)  10/06/20 231 lb 7.7 oz (105 kg)   Constitutional: overweight, in NAD, + full supraclavicular fat pads Eyes: PERRLA, EOMI, no exophthalmos ENT: moist mucous membranes, no thyromegaly, no cervical lymphadenopathy Cardiovascular: RRR, No MRG Respiratory: CTA B Musculoskeletal: no deformities, strength intact in all 4 Skin: moist, warm, no rashes Neurological: no tremor with outstretched hands, DTR normal  in all  4  ASSESSMENT: 1. Hypothyroidism due to Hashimoto's thyroiditis.  2.  Thyroid nodule  3.  Prediabetes  PLAN:  1. Patient with longstanding hypothyroidism, on levothyroxine therapy.  This is most likely due to Hashimoto's thyroiditis based on high TPO antibodies. - latest thyroid labs reviewed with pt. >> normal: Lab Results  Component Value Date   TSH 3.81 05/02/2021  - she continues on LT4 (crushed) 225 mcg daily (dose increased at last visit after TSH returned elevated, at 14.8) - pt feels good on this dose. - we discussed about taking the thyroid hormone every day, with water, >30 minutes before breakfast, separated by >4 hours from acid reflux medications, calcium, iron, multivitamins. Pt. is taking it correctly now, at last visit, we moved to PPI later in the day - will check thyroid tests today: TSH and fT4 - If labs are abnormal, she will need to return for repeat TFTs in 1.5 months - OTW, I would see her back in a year  2.  Thyroid nodule -Reviewed her thyroid ultrasound from 03/16/2020: This showed a 1.7 cm right mid nodule, possibly developed from a previous cyst -She had an FNA of this lesion on 03/18/2020 and this was inconclusive (AUS) -However, the sample was sent for Afirma molecular marker and this returned benign, which has a very good predictive value for benignity -At previous visit she mentioned occasional dysphagia, neck pressure and shortness of breath when turning head and raising arms -At last visit, symptoms were improved, but still had some pressure in her neck possibly due to fluid retention  -We will continue to follow her clinically and we would check another ultrasound in 2 years after the previous, at next visit  3.  Prediabetes -She is not on medication for this -Latest HbA1c available for review was from 11/11/2020: 5.7%, decreased from 6.3% -At last visit, we discussed about the risk of diabetes in patients with prediabetes and the fact that this is  reversible with weight loss and improved diet.  She was not successful in losing weight since last visit.  More recently, she started to work with a nutritionist. -We will recheck HbA1c today  Needs reflls to [facility] for 3 mo regardless of the dose.  Component     Latest Ref Rng & Units 09/08/2021  T4,Free(Direct)     0.60 - 1.60 ng/dL 0.86  TSH     0.35 - 5.50 uIU/mL 5.01  Hemoglobin A1C     4.6 - 6.5 % 5.9  TFTs are normal. TSH is at the ULN, but due to previous variability in her TFTs, I will suggest to continue the same dose of levothyroxine for now. HbA1c slightly higher.  She does started work with a nutritionist, which is a great idea.  [name], MD PhD [facility] Endocrinology

## 2021-09-09 ENCOUNTER — Other Ambulatory Visit: Payer: Self-pay | Admitting: Internal Medicine

## 2021-09-09 ENCOUNTER — Encounter: Payer: Self-pay | Admitting: Internal Medicine

## 2021-09-09 DIAGNOSIS — E039 Hypothyroidism, unspecified: Secondary | ICD-10-CM

## 2021-09-09 MED ORDER — SYNTHROID 25 MCG PO TABS: 25.0000 ug | ORAL_TABLET | Freq: Every day | ORAL | 3 refills | Status: DC

## 2021-09-09 MED ORDER — SYNTHROID 200 MCG PO TABS: 200.0000 ug | ORAL_TABLET | Freq: Every day | ORAL | 3 refills | Status: DC

## 2021-10-07 ENCOUNTER — Ambulatory Visit: Payer: 59

## 2021-10-07 ENCOUNTER — Ambulatory Visit
Admission: RE | Admit: 2021-10-07 | Discharge: 2021-10-07 | Disposition: A | Discharge status: Home / Self Care | Payer: 59 | Source: Ambulatory Visit | Attending: Obstetrics and Gynecology | Admitting: Obstetrics and Gynecology

## 2021-10-07 DIAGNOSIS — N644 Mastodynia: Secondary | ICD-10-CM

## 2022-03-09 ENCOUNTER — Encounter: Payer: Self-pay | Admitting: Internal Medicine

## 2022-03-09 ENCOUNTER — Ambulatory Visit: Payer: 59 | Admitting: Internal Medicine

## 2022-03-09 VITALS — BP 144/90 | HR 81 | Ht 67.5 in | Wt 242.2 lb

## 2022-03-09 DIAGNOSIS — R7303 Prediabetes: Secondary | ICD-10-CM | POA: Diagnosis not present

## 2022-03-09 DIAGNOSIS — E039 Hypothyroidism, unspecified: Secondary | ICD-10-CM

## 2022-03-09 DIAGNOSIS — E041 Nontoxic single thyroid nodule: Secondary | ICD-10-CM | POA: Diagnosis not present

## 2022-03-09 LAB — T4, FREE: Free T4: 1.34 ng/dL (ref 0.60–1.60)

## 2022-03-09 LAB — TSH: TSH: 0.42 u[IU]/mL (ref 0.35–5.50)

## 2022-03-09 NOTE — Progress Notes (Addendum)
Patient ID: Natalie Camacho, female   DOB: [birth year], 56 y.o.   MRN: [id number]   HPI  Natalie Camacho is a 56 y.o.-year-old female, presenting for follow-up for hypothyroidism, thyroid cyst, and prediabetes.  Last visit 6 months ago.  Interim history: No increased urination, blurry vision, nausea, chest pain. However, she describes fatigue (chronic), dry skin, hair loss, cold intolerance (new).  Reviewed and addended history: Pt. has been dx with goiter and hypothyroidism in 1992 >> on high doses of levothyroxine, dose increased to 225 mcg daily in 07/2019.  TSH normalized on this dose in 09/2019.  In 07/2019, LT4 dose was increased to 225 mcg daily  In 09/2019, TSH was normal on this dose  In 04/2020, TSH was suppressed, so we decreased LT4 to 200 mcg daily  In 07/2020, TSH is very high but we did not change the dose suspecting noncompliance.  In 09/2020, TSH was normal.  In 11/2020, a TSH checked by PCP was higher, at 8.39.  In 12/2020, TSH was again slightly high, and I advised her to start crushing the LT4 200 mcg tablet  In 03/2021, we moved omeprazole later in the day (stopped since) and increased the dose of  LT4 to 225 mcg daily and started crushing the tablet for better absorption.  Patient takes her Synthroid DAW: - no missed doses! - in am (around 5:30 AM) - fasting - at least 30 min from b'fast, usually after 6:30 am - no Ca, Fe - + added Omeprazole (Nexium) x 1 week (>2h after LT4) >> stopped 1 month ago - + Multivitamins at night - not on Biotin  Reviewed her TFTs: Lab Results  Component Value Date   TSH 5.01 09/08/2021   TSH 3.81 05/02/2021   TSH 14.84 (H) 03/07/2021   TSH 7.22 (H) 12/31/2020   TSH 3.95 09/16/2020   TSH 20.87 (H) 07/27/2020   TSH 0.28 (L) 04/21/2020   TSH 0.18 (L) 03/08/2020   TSH 2.89 09/30/2019   TSH 8.10 (H) 07/09/2019   FREET4 0.86 09/08/2021   FREET4 0.94 05/02/2021   FREET4 0.92 03/07/2021   FREET4 1.09 12/31/2020   FREET4  0.91 09/16/2020   FREET4 0.74 07/27/2020   FREET4 1.14 04/21/2020   FREET4 1.48 03/08/2020   FREET4 1.09 09/30/2019   FREET4 1.07 07/09/2019  11/11/2020: TSH 8.39 05/2019: Reported TSH 11.58. 05/06/2018: TSH 0.57 10/08/2017: TSH 0.22 -she was on biotin at the time 08/10/2017: TSH 0.85, free T4 1.03 07/09/2017: TSH 8.70, free T4 0.76 06/14/2017: TSH 5.51, free T4, 0.65 12/26/2016: TSH 3.08, free T4 0.8   Thyroid ultrasound 06/29/2017: 0.9 cm small cystic nodule in the right inferior thyroid lobe  Thyroid ultrasound (03/16/2020): Parenchymal Echotexture: Choose 4 Isthmus: 3 mm Right lobe: 3.6 x 1.2 x 1.5 cm Left lobe: 4.7 x 1.1 x 1.2 cm _________________________________________________________   Nodule # 1: Location: Right; Mid Maximum size: 1.7 cm; Other 2 dimensions: 1.2 x 1.2 cm Composition: solid/almost completely solid (2) Echogenicity: hypoechoic (2) Echogenic foci: macrocalcifications (1)  **Given size (>/= 1.5 cm) and appearance, fine needle aspiration of this moderately suspicious nodule should be considered based on TI-RADS criteria. _____________________________________________________   No significant left thyroid abnormality or nodule. No hypervascularity or regional adenopathy.   IMPRESSION: 1.7 cm right mid thyroid TR 4 nodule meets criteria for biopsy as above.  FNA (03/18/2020): Atypia of unknown significance Afirma molecular marker: Benign  Pt mentions: - no feeling nodules in neck - no hoarseness + occasional dysphagia + occasional  choking She has OSA-on CPAP.  She has + FH of thyroid disorders in: hypothyroidism in mother and sister. No FH of thyroid cancer. No h/o radiation tx to head or neck. No herbal supplements. No Biotin use. No recent steroids use.   Prediabetes:  Reviewed HbA1c levels: Lab Results  Component Value Date   HGBA1C 5.9 09/08/2021  11/11/2020: HbA1c 5.7%. 11/10/2019: HbA1c 6.3%.   She has menopausal hot flashes>> better on  estradiol. She has back pain and gets nerve blocks every 3-4 months through the pain clinic. Last injection last month. Pt. also has a history of GERD, iron deficiency anemia, asthma (on Albuterol).   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    ROS: + See HPI  I reviewed pt's medications, allergies, PMH, social hx, family hx, and changes were documented in the history of present illness. Otherwise, unchanged from my initial visit note.  Past Medical History:  Diagnosis Date   Anemia    Anxiety    Asthma    Complex regional pain syndrome type II    Depression    GERD (gastroesophageal reflux disease)    Headache(784.0)    Heart murmur    Hypertension    Hypothyroidism    PONV (postoperative nausea and vomiting)    Sleep apnea    Past Surgical History:  Procedure Laterality Date   ABDOMINOPLASTY     CARPAL TUNNEL RELEASE     FRACTURE SURGERY     ORIF rt ankle, tendon sheath removed   fundal     fundal plication   HERNIA REPAIR  2001   NOSE SURGERY     RECTOCELE REPAIR     VENTRAL HERNIA REPAIR N/A 10/06/2020   Procedure: VENTRAL INCISIONAL HERNIA REPAIR WITH MESH;  Surgeon: Donnie Mesa, MD;  Location: [facility];  Service: General;  Laterality: N/A;  LMA   Social History   Socioeconomic History   Marital status: Married    Spouse name: Not on file   Number of children: 1  Social Needs  Occupational History    Retired  Tobacco Use   Smoking status: Never Smoker   Smokeless tobacco: Never Used  Substance and Sexual Activity   Alcohol use:  Liquor 3 times a year, 1-2 drinks   Drug use: No   Current Outpatient Medications on File Prior to Visit  Medication Sig Dispense Refill   acetaminophen (TYLENOL) 500 MG tablet Take 1,000 mg by mouth every 6 (six) hours as needed for headache (pain).     albuterol (VENTOLIN HFA) 108 (90 Base) MCG/ACT inhaler Inhale 2 puffs into the lungs every 6 (six) hours as needed for wheezing or shortness of breath.      brexpiprazole (REXULTI) 1 MG TABS tablet Take 1 mg by mouth daily.     buPROPion (WELLBUTRIN XL) 300 MG 24 hr tablet Take 300 mg by mouth daily.     Cholecalciferol (VITAMIN D-3) 5000 units TABS Take 5,000 Units by mouth daily.     DULoxetine (CYMBALTA) 60 MG capsule Take 120 mg by mouth daily.     Estradiol-Norethindrone Acet 0.5-0.1 MG tablet Take 1 tablet by mouth daily.     hydrochlorothiazide (HYDRODIURIL) 25 MG tablet Take 25 mg by mouth daily.     HYDROcodone-acetaminophen (NORCO/VICODIN) 5-325 MG tablet Take 1 tablet by mouth every 6 (six) hours as needed for moderate pain. 15 tablet 0   hydroxypropyl methylcellulose / hypromellose (ISOPTO TEARS / GONIOVISC) 2.5 % ophthalmic solution Place 1  drop into the left eye 4 (four) times daily as needed for dry eyes. 15 mL 12   ibuprofen (ADVIL,MOTRIN) 200 MG tablet Take 400-600 mg by mouth every 6 (six) hours as needed for headache (pain).     loratadine (CLARITIN) 10 MG tablet Take 10 mg by mouth daily as needed (pet allergies).     modafinil (PROVIGIL) 200 MG tablet Take 200 mg by mouth 2 (two) times daily. 1 in the morning and  Half in the afternoon.     Multiple Vitamin (MULTIVITAMIN WITH MINERALS) TABS tablet Take 1 tablet by mouth at bedtime.     NITROFURANTOIN PO Take 100 mg by mouth daily.     Polyethyl Glycol-Propyl Glycol (SYSTANE OP) Place 1 drop into both eyes daily as needed (dry eyes/ irritation).     potassium chloride SA (K-DUR,KLOR-CON) 20 MEQ tablet Take 20 mEq by mouth daily.     PRESCRIPTION MEDICATION Inhale into the lungs at bedtime. CPAP     rOPINIRole (REQUIP) 3 MG tablet Take 3 mg by mouth at bedtime.     simvastatin (ZOCOR) 20 MG tablet Take 20 mg by mouth at bedtime.     SYNTHROID 200 MCG tablet Take 1 tablet (200 mcg total) by mouth daily before breakfast. 90 tablet 3   SYNTHROID 25 MCG tablet Take 1 tablet (25 mcg total) by mouth daily before breakfast. 90 tablet 3   topiramate (TOPAMAX) 100 MG tablet Take 200 mg  by mouth 2 (two) times daily.      valACYclovir (VALTREX) 1000 MG tablet Take 1 tablet (1,000 mg total) by mouth 3 (three) times daily. 21 tablet 0   zolpidem (AMBIEN) 10 MG tablet Take 10 mg by mouth at bedtime.     No current facility-administered medications on file prior to visit.   Allergies  Allergen Reactions   Accolate [Zafirlukast] Hives   Aspirin Other (See Comments)    wheezing   Augmentin [Amoxicillin-Pot Clavulanate] Hives and Nausea And Vomiting   Codeine Nausea And Vomiting   Erythromycin Nausea And Vomiting   Lyrica [Pregabalin] Other (See Comments)    Suicidal thoughts    Peanut-Containing Drug Products Other (See Comments)    Wheezing    Family History  Problem Relation Age of Onset   Hypothyroidism Mother    Diabetes Father   Also,  heart disease in grandmother  diabetes in sister Lung cancer in grandfather  PE: BP (!) 144/90 (BP Location: Right Arm, Patient Position: Sitting, Cuff Size: Normal)   Pulse 81   Ht 5' 7.5" (1.715 m)   Wt 242 lb 3.2 oz (109.9 kg)   SpO2 95%   BMI 37.37 kg/m  Wt Readings from Last 3 Encounters:  03/09/22 242 lb 3.2 oz (109.9 kg)  09/08/21 245 lb 12.8 oz (111.5 kg)  03/07/21 246 lb 6.4 oz (111.8 kg)   Constitutional: in NAD, + full supraclavicular fat pads Eyes: PERRLA, EOMI, no exophthalmos ENT: moist mucous membranes, no thyromegaly, no cervical lymphadenopathy Cardiovascular: RRR, No MRG Respiratory: CTA B Musculoskeletal: no deformities Skin: moist, warm, no rashes Neurological: no tremor with outstretched hands  ASSESSMENT: 1. Hypothyroidism due to Hashimoto's thyroiditis.  2.  Thyroid nodule  3.  Prediabetes  PLAN:  1. Patient with longstanding, autoimmune hypothyroidism, on levothyroxine therapy. - latest thyroid labs reviewed with pt. >> normal: Lab Results  Component Value Date   TSH 5.01 09/08/2021  - she continues on LT4 225 mcg daily (tried crushing the tablet >> TSH was higher  last year) - pt  feels cold, has dry skin, brittle nails, hair loss, constipation, fatigue.  We discussed that this may be symptoms of hypothyroidism, but other conditions can also cause this for example vitamin deficiencies, dehydration, etc. - we discussed about taking the thyroid hormone every day, with water, >30 minutes before breakfast, separated by >4 hours from acid reflux medications, calcium, iron, multivitamins. Pt. is taking it correctly. - will check thyroid tests today: TSH and fT4 - If labs are abnormal, she will need to return for repeat TFTs in 1.5 months - I will see her back in 1 year  2.  Thyroid nodule -Reviewed her thyroid ultrasound report from 03/16/2020: This showed a 1.7 cm right mid nodule, possibly developed from a previous cyst -She had an FNA of this lesion on 03/18/2020 and this was inconclusive (AUS) -However, the sample was sent for Afirma molecular marker and this returned benign -She previously described occasional dysphagia, neck pressure, and shortness of breath when turning head and raising arms, but these improved -We will continue to follow her clinically and also check another ultrasound now  3.  Prediabetes -Not on medication for this -At last visit, HbA1c was 5.9%, slightly higher -We discussed about the risk of diabetes in patients with prediabetes at last visit.  We also discussed that prediabetes is reversible with weight loss and improved diet.  She does started working with a nutritionist before our last visit. -HbA1c recheck today>> 5.3 % (lower)  Component     Latest Ref Rng 03/09/2022  TSH     0.35 - 5.50 uIU/mL 0.42   T4,Free(Direct)     0.60 - 1.60 ng/dL 1.34   TFTs are normal. We will continue the same dose of levothyroxine. I would recommend to recheck her TFTs in 3 to 4 months to make sure that the TSH is not decreasing too much.  Thyroid U/S (03/22/2022): Parenchymal Echotexture: Markedly heterogenous  Isthmus: 0.4 cm Right lobe: 3.4 cm x 1.5 cm x  2.3 cm  Left lobe: 3.6 cm x 1.3 cm x 1.2 cm  _________________________________________________________   Estimated total number of nodules >/= 1 cm: 2 ________________________________________________________   Nodule labeled 1, mid right thyroid, 1.4 cm, previously 1.7 cm. This nodule has undergone biopsy 03/18/2020. Assuming benign result, no further specific follow-up would be indicated.   Nodule labeled 2, left mid thyroid. The appearance is similar to the comparison ultrasound survey of 03/16/2020, with overall heterogeneous thyroid tissue in this location, such as the appearance on image 32/42 of the prior. If this is a nodule and not a pseudo nodule, this would be a TR 3 nodule, given the overall appearance in the remainder of the gland.   No adenopathy   IMPRESSION: Similar appearance of atrophic heterogeneous thyroid suggesting medical thyroid disease.   Assuming benign result of the prior right mid thyroid biopsy, no further specific follow-up would be indicated.   Pseudo nodule versus TR 3 nodule in the left thyroid lobe. Surveillance strategy may be initiated, as designated by the newly established ACR TI-RADS criteria. Surveillance ultrasound study recommended to be performed annually up to 5 years.  [name], MD PhD [facility] Endocrinology

## 2022-03-09 NOTE — Patient Instructions (Signed)
Please continue Synthroid  225 mcg daily.  Take the thyroid hormone every day, with water, at least 30 minutes before breakfast, separated by at least 4 hours from: - acid reflux medications - calcium - iron - multivitamins  Please stop at the lab.  Please return in 6 months.

## 2022-03-10 LAB — POCT GLYCOSYLATED HEMOGLOBIN (HGB A1C): Hemoglobin A1C: 5.3 % (ref 4.0–5.6)

## 2022-03-13 IMAGING — US US THYROID
1 series · 13 of 25 positions shown · non-contrast
Comparison: 06/28/2017

CLINICAL DATA: Thyroid nodule

EXAM:
THYROID ULTRASOUND
TECHNIQUE: Ultrasound examination of the thyroid gland and adjacent soft
tissues was performed.

[Series 1: us thyroid · 0.06mm/px · 13 of 41 slices shown]
[im 1/41]
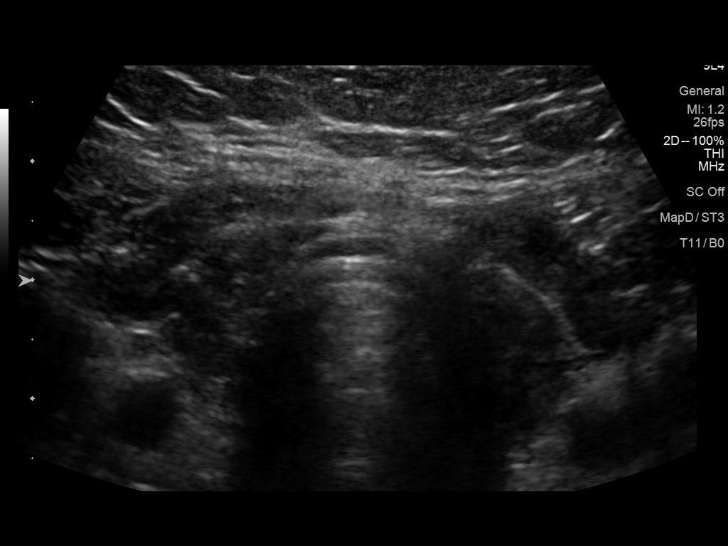
[im 4/41]
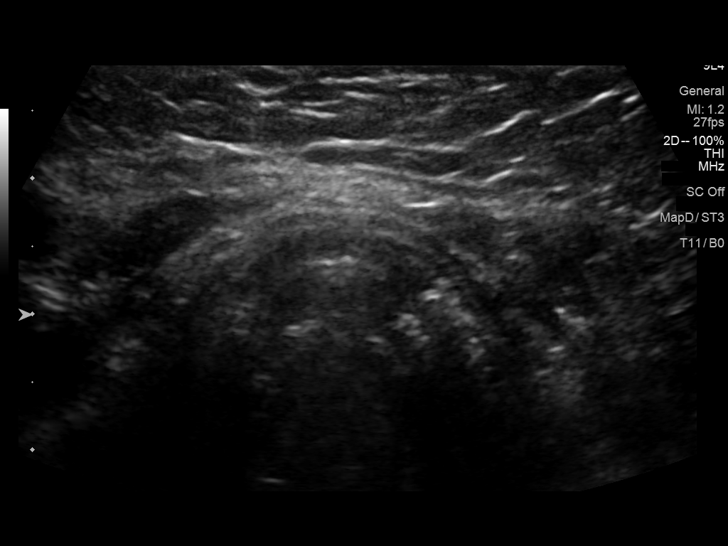
[im 7/41]
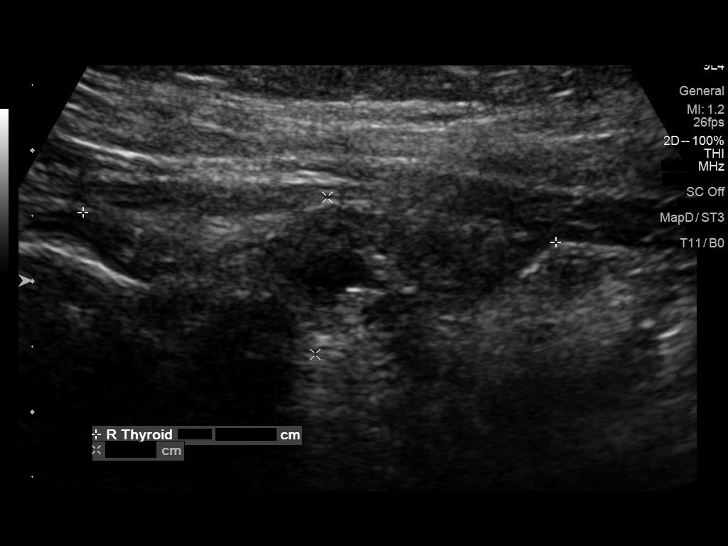
[im 11/41]
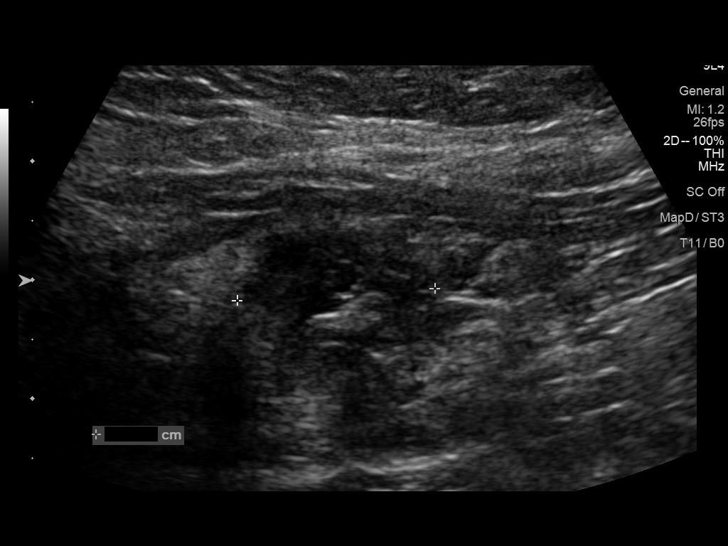
[im 14/41]
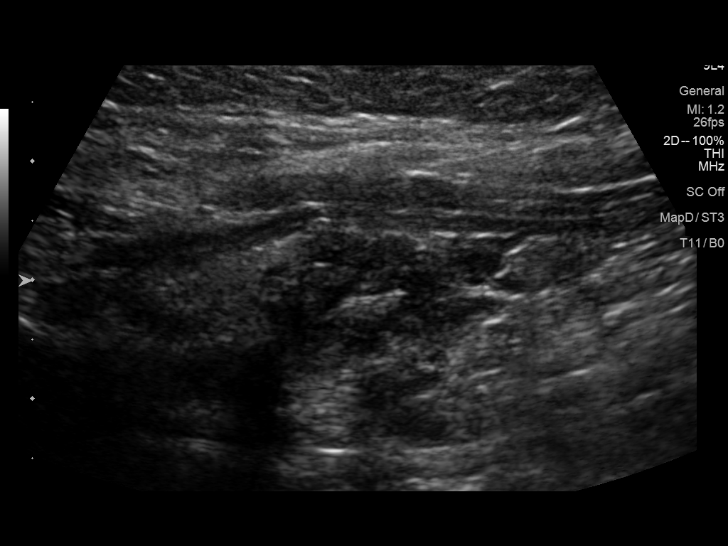
[im 17/41]
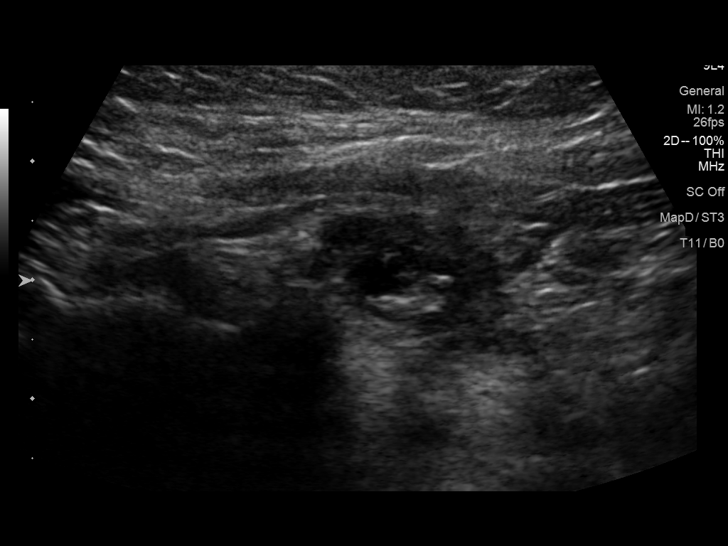
[im 21/41]
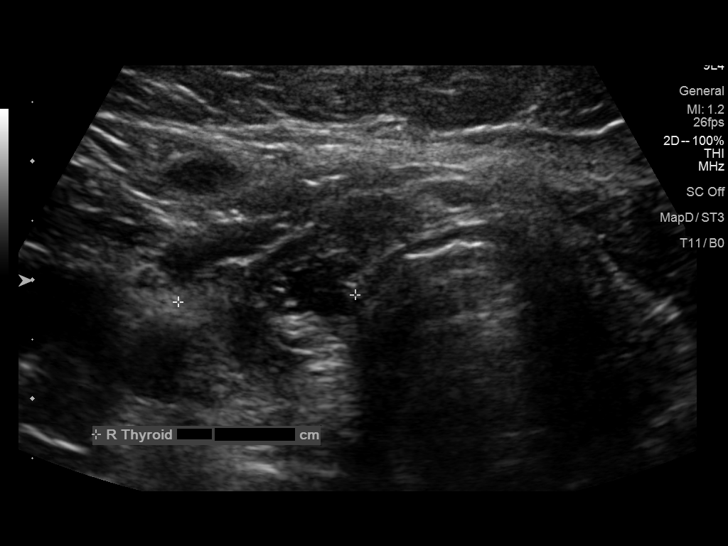
[im 24/41]
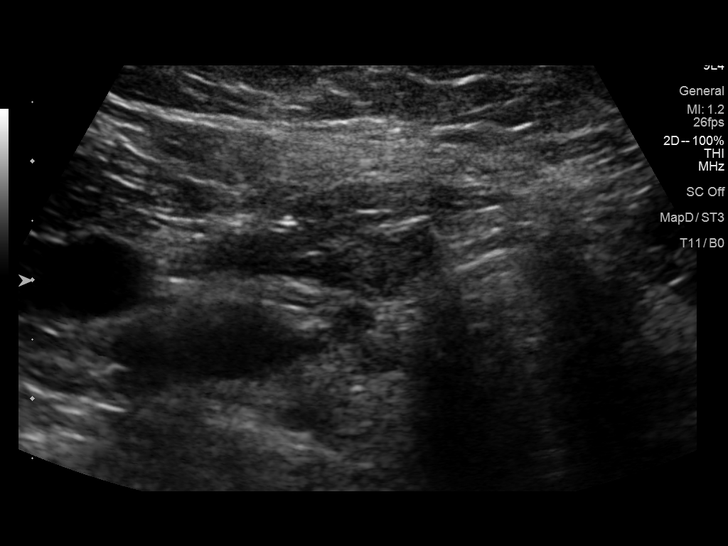
[im 27/41]
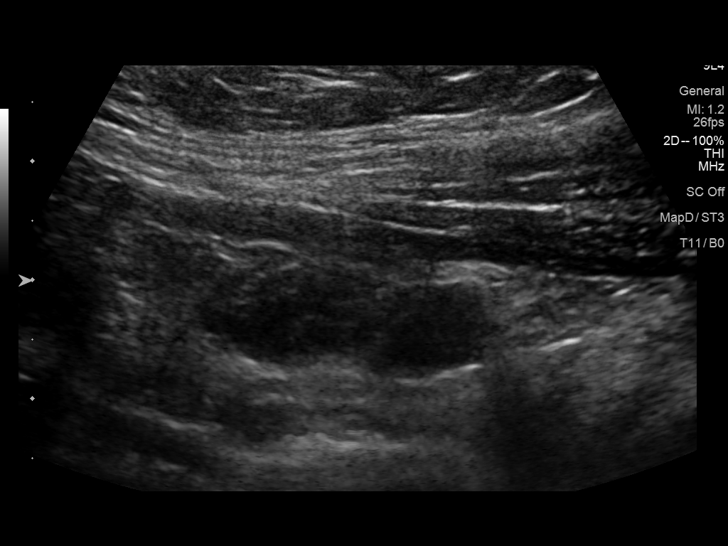
[im 31/41]
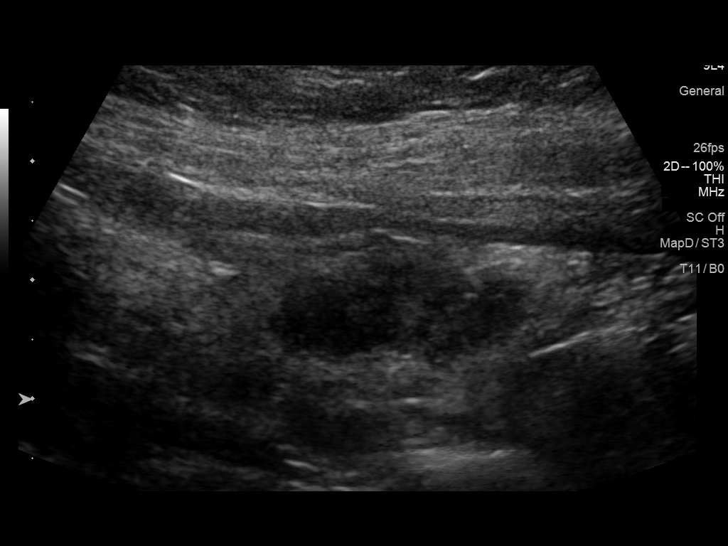
[im 34/41]
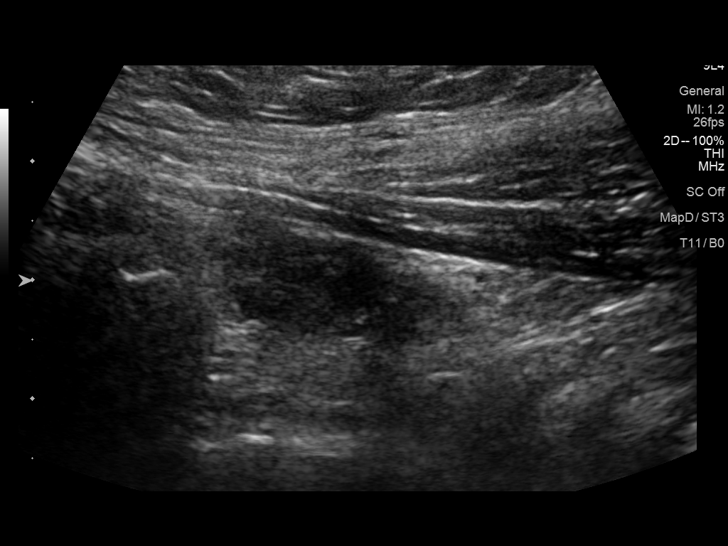
[im 37/41]
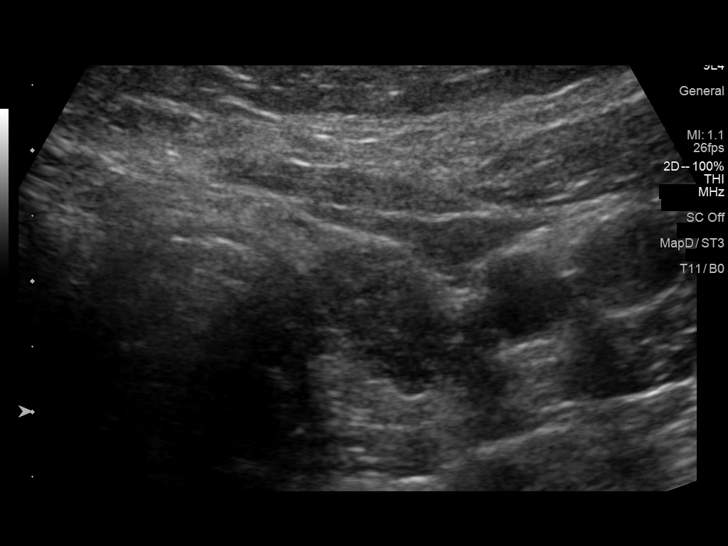
[im 41/41]
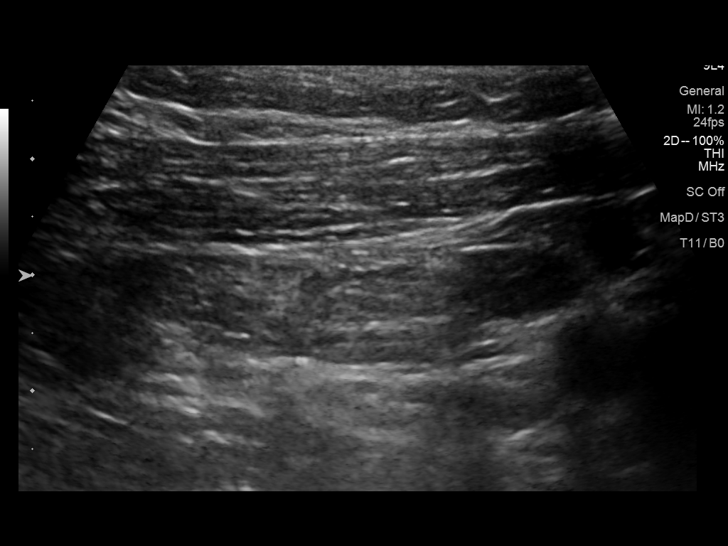

[13 of 25 positions shown; findings below may reference images not displayed]

FINDINGS: Parenchymal Echotexture: Choose 4

Isthmus: 3 mm

Right lobe: 3.6 x 1.2 x 1.5 cm

Left lobe: 4.7 x 1.1 x 1.2 cm

_________________________________________________________

Estimated total number of nodules >/= 1 cm: 1

Number of spongiform nodules >/=  2 cm not described below (TR1): 0

Number of mixed cystic and solid nodules >/= 1.5 cm not described
below (TR2): 0

_________________________________________________________

Nodule # 1:

Location: Right; Mid

Maximum size: 1.7 cm; Other 2 dimensions: 1.2 x 1.2 cm

Composition: solid/almost completely solid (2)

Echogenicity: hypoechoic (2)

Shape: not taller-than-wide (0)

Margins: ill-defined (0)

Echogenic foci: macrocalcifications (1)

ACR TI-RADS total points: 5.

ACR TI-RADS risk category: TR4 (4-6 points).

ACR TI-RADS recommendations:

**Given size (>/= 1.5 cm) and appearance, fine needle aspiration of
this moderately suspicious nodule should be considered based on
TI-RADS criteria.

_________________________________________________________

No significant left thyroid abnormality or nodule. No
hypervascularity or regional adenopathy.
IMPRESSION: 1.7 cm right mid thyroid TR 4 nodule meets criteria for biopsy as
above.

The above is in keeping with the ACR TI-RADS recommendations - [HOSPITAL] 3032;[DATE].

## 2022-03-22 ENCOUNTER — Ambulatory Visit (HOSPITAL_COMMUNITY)
Admission: RE | Admit: 2022-03-22 | Discharge: 2022-03-22 | Disposition: A | Discharge status: Home / Self Care | Payer: 59 | Source: Ambulatory Visit | Attending: Internal Medicine | Admitting: Internal Medicine

## 2022-03-22 DIAGNOSIS — E041 Nontoxic single thyroid nodule: Secondary | ICD-10-CM | POA: Insufficient documentation

## 2022-07-14 ENCOUNTER — Other Ambulatory Visit: Payer: Self-pay | Admitting: Internal Medicine

## 2022-07-14 DIAGNOSIS — E039 Hypothyroidism, unspecified: Secondary | ICD-10-CM

## 2022-07-17 ENCOUNTER — Other Ambulatory Visit (INDEPENDENT_AMBULATORY_CARE_PROVIDER_SITE_OTHER): Payer: 59

## 2022-07-17 DIAGNOSIS — E039 Hypothyroidism, unspecified: Secondary | ICD-10-CM

## 2022-07-17 LAB — TSH: TSH: 0.71 u[IU]/mL (ref 0.35–5.50)

## 2022-07-17 LAB — T4, FREE: Free T4: 1.28 ng/dL (ref 0.60–1.60)

## 2023-03-20 ENCOUNTER — Encounter: Payer: Self-pay | Admitting: Internal Medicine

## 2023-03-20 ENCOUNTER — Ambulatory Visit: Payer: 59 | Admitting: Internal Medicine

## 2023-03-20 VITALS — BP 118/62 | HR 86 | Ht 67.5 in | Wt 231.8 lb

## 2023-03-20 DIAGNOSIS — E039 Hypothyroidism, unspecified: Secondary | ICD-10-CM

## 2023-03-20 DIAGNOSIS — R7303 Prediabetes: Secondary | ICD-10-CM | POA: Diagnosis not present

## 2023-03-20 DIAGNOSIS — E041 Nontoxic single thyroid nodule: Secondary | ICD-10-CM | POA: Diagnosis not present

## 2023-03-20 LAB — TSH: TSH: 0.53 u[IU]/mL (ref 0.35–5.50)

## 2023-03-20 LAB — HEMOGLOBIN A1C: Hgb A1c MFr Bld: 5.5 % (ref 4.6–6.5)

## 2023-03-20 LAB — T4, FREE: Free T4: 1.23 ng/dL (ref 0.60–1.60)

## 2023-03-20 NOTE — Patient Instructions (Signed)
Please continue Synthroid  225 mcg daily.  Take the thyroid hormone every day, with water, at least 30 minutes before breakfast, separated by at least 4 hours from: - acid reflux medications - calcium - iron - multivitamins  Please stop at the lab.  Please return in 6 months.   

## 2023-03-20 NOTE — Progress Notes (Addendum)
Patient ID: Natalie Camacho, female   DOB: June 14, 1966, 57 y.o.   MRN: [id number]   HPI  Natalie Camacho is a 57 y.o.-year-old female, presenting for follow-up for hypothyroidism, thyroid cyst, and prediabetes.  Last visit 1 year ago.  Interim history: No increased urination, blurry vision, nausea, chest pain. She had a spinal cord stimulator for pain in R leg - CRPS. This works!  Reviewed and addended history: Pt. has been dx with goiter and hypothyroidism in 1992 >> on high doses of levothyroxine, dose increased to 225 mcg daily in 07/2019.  TSH normalized on this dose in 09/2019.  In 07/2019, LT4 dose was increased to 225 mcg daily  In 09/2019, TSH was normal on this dose  In 04/2020, TSH was suppressed, so we decreased LT4 to 200 mcg daily  In 07/2020, TSH is very high but we did not change the dose suspecting noncompliance.  In 09/2020, TSH was normal.  In 11/2020, a TSH checked by PCP was higher, at 8.39.  In 12/2020, TSH was again slightly high, and I advised her to start crushing the LT4 200 mcg tablet  In 03/2021, we moved omeprazole later in the day (stopped since) and increased the dose of  LT4 to 225 mcg daily and started crushing the tablet for better absorption.  Patient takes her Synthroid DAW: - she missed doses! - missed few doses during the flu 2 mo ago - in am (around 5:30 AM) - fasting - at least 30 min from b'fast - no Ca, Fe - prev. Omeprazole (Nexium) x 1 week (>2h after LT4) >> stopped  - + Multivitamins at night - not on Biotin  Reviewed her TFTs: Lab Results  Component Value Date   TSH 0.71 07/17/2022   TSH 0.42 03/09/2022   TSH 5.01 09/08/2021   TSH 3.81 05/02/2021   TSH 14.84 (H) 03/07/2021   TSH 7.22 (H) 12/31/2020   TSH 3.95 09/16/2020   TSH 20.87 (H) 07/27/2020   TSH 0.28 (L) 04/21/2020   TSH 0.18 (L) 03/08/2020   FREET4 1.28 07/17/2022   FREET4 1.34 03/09/2022   FREET4 0.86 09/08/2021   FREET4 0.94 05/02/2021   FREET4 0.92  03/07/2021   FREET4 1.09 12/31/2020   FREET4 0.91 09/16/2020   FREET4 0.74 07/27/2020   FREET4 1.14 04/21/2020   FREET4 1.48 03/08/2020  11/11/2020: TSH 8.39 05/2019: Reported TSH 11.58. 05/06/2018: TSH 0.57 10/08/2017: TSH 0.22 - on biotin at the time 08/10/2017: TSH 0.85, free T4 1.03 07/09/2017: TSH 8.70, free T4 0.76 06/14/2017: TSH 5.51, free T4, 0.65 12/26/2016: TSH 3.08, free T4 0.8   Thyroid ultrasound 06/29/2017: 0.9 cm small cystic nodule in the right inferior thyroid lobe  Thyroid ultrasound (03/16/2020): Parenchymal Echotexture: Choose 4 Isthmus: 3 mm Right lobe: 3.6 x 1.2 x 1.5 cm Left lobe: 4.7 x 1.1 x 1.2 cm _________________________________________________________   Nodule # 1: Location: Right; Mid Maximum size: 1.7 cm; Other 2 dimensions: 1.2 x 1.2 cm Composition: solid/almost completely solid (2) Echogenicity: hypoechoic (2) Echogenic foci: macrocalcifications (1) **Given size (>/= 1.5 cm) and appearance, fine needle aspiration of this moderately suspicious nodule should be considered based on TI-RADS criteria. _____________________________________________________   No significant left thyroid abnormality or nodule. No hypervascularity or regional adenopathy.   IMPRESSION: 1.7 cm right mid thyroid TR 4 nodule meets criteria for biopsy as above.  FNA (03/18/2020): Atypia of unknown significance Afirma molecular marker: Benign  Thyroid U/S (03/22/2022): Parenchymal Echotexture: Markedly heterogenous  Isthmus: 0.4 cm Right lobe:  3.4 cm x 1.5 cm x 2.3 cm  Left lobe: 3.6 cm x 1.3 cm x 1.2 cm  _________________________________________________________   Estimated total number of nodules >/= 1 cm: 2 ________________________________________________________   Nodule labeled 1, mid right thyroid, 1.4 cm, previously 1.7 cm. This nodule has undergone biopsy 03/18/2020. Assuming benign result, no further specific follow-up would be indicated.   Nodule labeled 2,  left mid thyroid. The appearance is similar to the comparison ultrasound survey of 03/16/2020, with overall heterogeneous thyroid tissue in this location, such as the appearance on image 32/42 of the prior. If this is a nodule and not a pseudo nodule, this would be a TR 3 nodule, given the overall appearance in the remainder of the gland.   No adenopathy   IMPRESSION: Similar appearance of atrophic heterogeneous thyroid suggesting medical thyroid disease.   Assuming benign result of the prior right mid thyroid biopsy, no further specific follow-up would be indicated.   Pseudo nodule versus TR 3 nodule in the left thyroid lobe. Surveillance strategy may be initiated, as designated by the newly established ACR TI-RADS criteria. Surveillance ultrasound study recommended to be performed annually up to 5 years.  Pt mentions: - no feeling nodules in neck - no hoarseness + occasional dysphagia + occasional choking She has OSA-on CPAP.  She has + FH of thyroid disorders in: hypothyroidism in mother and sister. No FH of thyroid cancer. No h/o radiation tx to head or neck. No herbal supplements. No Biotin use. No recent steroids use.   Prediabetes:  Reviewed HbA1c levels: Lab Results  Component Value Date   HGBA1C 5.3 03/10/2022  11/11/2020: HbA1c 5.7%. 11/10/2019: HbA1c 6.3%.   She has menopausal hot flashes>> better on estradiol. Pt. also has a history of GERD, iron deficiency anemia, asthma (on Albuterol).   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    ROS: + See HPI  I reviewed pt's medications, allergies, PMH, social hx, family hx, and changes were documented in the history of present illness. Otherwise, unchanged from my initial visit note.  Past Medical History:  Diagnosis Date   Anemia    Anxiety    Asthma    Complex regional pain syndrome type II    Depression    GERD (gastroesophageal reflux disease)    Headache(784.0)    Heart murmur    Hypertension     Hypothyroidism    PONV (postoperative nausea and vomiting)    Sleep apnea    Past Surgical History:  Procedure Laterality Date   ABDOMINOPLASTY     CARPAL TUNNEL RELEASE     FRACTURE SURGERY     ORIF rt ankle, tendon sheath removed   fundal     fundal plication   HERNIA REPAIR  2001   NOSE SURGERY     RECTOCELE REPAIR     VENTRAL HERNIA REPAIR N/A 10/06/2020   Procedure: VENTRAL INCISIONAL HERNIA REPAIR WITH MESH;  Surgeon: Manus Rudd, MD;  Location: [facility];  Service: General;  Laterality: N/A;  LMA   Social History   Socioeconomic History   Marital status: Married    Spouse name: Not on file   Number of children: 1  Social Needs  Occupational History    Retired  Tobacco Use   Smoking status: Never Smoker   Smokeless tobacco: Never Used  Substance and Sexual Activity   Alcohol use:  Liquor 3 times a year, 1-2 drinks   Drug use: No   Current Outpatient  Medications on File Prior to Visit  Medication Sig Dispense Refill   acetaminophen (TYLENOL) 500 MG tablet Take 1,000 mg by mouth every 6 (six) hours as needed for headache (pain).     albuterol (VENTOLIN HFA) 108 (90 Base) MCG/ACT inhaler Inhale 2 puffs into the lungs every 6 (six) hours as needed for wheezing or shortness of breath.     brexpiprazole (REXULTI) 1 MG TABS tablet Take 1 mg by mouth daily.     buPROPion (WELLBUTRIN XL) 300 MG 24 hr tablet Take 300 mg by mouth daily.     Cholecalciferol (VITAMIN D-3) 5000 units TABS Take 5,000 Units by mouth daily.     DULoxetine (CYMBALTA) 60 MG capsule Take 120 mg by mouth daily.     Estradiol-Norethindrone Acet 0.5-0.1 MG tablet Take 1 tablet by mouth daily.     hydrochlorothiazide (HYDRODIURIL) 25 MG tablet Take 25 mg by mouth daily.     HYDROcodone-acetaminophen (NORCO/VICODIN) 5-325 MG tablet Take 1 tablet by mouth every 6 (six) hours as needed for moderate pain. 15 tablet 0   hydroxypropyl methylcellulose / hypromellose (ISOPTO TEARS / GONIOVISC)  2.5 % ophthalmic solution Place 1 drop into the left eye 4 (four) times daily as needed for dry eyes. 15 mL 12   ibuprofen (ADVIL,MOTRIN) 200 MG tablet Take 400-600 mg by mouth every 6 (six) hours as needed for headache (pain).     loratadine (CLARITIN) 10 MG tablet Take 10 mg by mouth daily as needed (pet allergies).     modafinil (PROVIGIL) 200 MG tablet Take 200 mg by mouth 2 (two) times daily. 1 in the morning and  Half in the afternoon.     Multiple Vitamin (MULTIVITAMIN WITH MINERALS) TABS tablet Take 1 tablet by mouth at bedtime.     NITROFURANTOIN PO Take 100 mg by mouth daily.     Polyethyl Glycol-Propyl Glycol (SYSTANE OP) Place 1 drop into both eyes daily as needed (dry eyes/ irritation).     potassium chloride SA (K-DUR,KLOR-CON) 20 MEQ tablet Take 20 mEq by mouth daily.     PRESCRIPTION MEDICATION Inhale into the lungs at bedtime. CPAP     rOPINIRole (REQUIP) 3 MG tablet Take 3 mg by mouth at bedtime.     simvastatin (ZOCOR) 20 MG tablet Take 20 mg by mouth at bedtime.     SYNTHROID 200 MCG tablet TAKE 1 TABLET BY MOUTH DAILY  BEFORE BREAKFAST 90 tablet 3   SYNTHROID 25 MCG tablet TAKE 1 TABLET BY MOUTH DAILY  BEFORE BREAKFAST 90 tablet 3   topiramate (TOPAMAX) 100 MG tablet Take 200 mg by mouth 2 (two) times daily.      valACYclovir (VALTREX) 1000 MG tablet Take 1 tablet (1,000 mg total) by mouth 3 (three) times daily. 21 tablet 0   zolpidem (AMBIEN) 10 MG tablet Take 10 mg by mouth at bedtime.     No current facility-administered medications on file prior to visit.   Allergies  Allergen Reactions   Accolate [Zafirlukast] Hives   Aspirin Other (See Comments)    wheezing   Augmentin [Amoxicillin-Pot Clavulanate] Hives and Nausea And Vomiting   Codeine Nausea And Vomiting   Erythromycin Nausea And Vomiting   Lyrica [Pregabalin] Other (See Comments)    Suicidal thoughts    Peanut-Containing Drug Products Other (See Comments)    Wheezing    Family History  Problem Relation  Age of Onset   Hypothyroidism Mother    Diabetes Father   Also,  heart disease in  grandmother  diabetes in sister Lung cancer in grandfather  PE: BP 118/62   Pulse 86   Ht 5' 7.5" (1.715 m)   Wt 231 lb 12.8 oz (105.1 kg)   SpO2 99%   BMI 35.77 kg/m  Wt Readings from Last 3 Encounters:  03/20/23 231 lb 12.8 oz (105.1 kg)  03/09/22 242 lb 3.2 oz (109.9 kg)  09/08/21 245 lb 12.8 oz (111.5 kg)   Constitutional: in NAD, + full supraclavicular fat pads Eyes: PERRLA, EOMI, no exophthalmos ENT: no thyromegaly, no cervical lymphadenopathy Cardiovascular: RRR, No MRG Respiratory: CTA B Musculoskeletal: no deformities Skin: no rashes Neurological: + tremor with outstretched hands  ASSESSMENT: 1. Hypothyroidism due to Hashimoto's thyroiditis.  2.  Thyroid nodules  3.  Prediabetes  PLAN:  1. Patient with longstanding autoimmune hypothyroidism, on levothyroxine therapy. - latest thyroid labs reviewed with pt. >> normal: Lab Results  Component Value Date   TSH 0.71 07/17/2022  - she continues on LT4 225 mcg daily (she tried crushing the tablet and the TSH was higher) - pt feels well with the only complaint being weight gain recently.  She initially lost approximately 30 pounds after her last visit but then she gained 20 back. - we discussed about taking the thyroid hormone every day, with water, >30 minutes before breakfast, separated by >4 hours from acid reflux medications, calcium, iron, multivitamins. Pt. is taking it correctly.  She missed several doses of levothyroxine approximately 1.5 to 2 months ago when she had the flu, but not since then. - will check thyroid tests today: TSH and fT4 - If labs are abnormal, she will need to return for repeat TFTs in 1.5 months - I will see her back in 1 year  2.  Thyroid nodules -Per review of the thyroid ultrasound report from 03/2020, she had a 1.7 cm right mid nodule, possibly developed from a previous cyst.  This was biopsied with  inconclusive results (AUS) in 03/2020, however, the Afirma molecular marker returned benign. -We checked another ultrasound after last visit (03/2022), and a 1.7 cm right nodule decreased in size to 1.4 cm, with no further follow-up recommended.  However, she also had a left thyroid nodule (or pseudonodule), for which follow-up was recommended for 5 years. -We will check another thyroid ultrasound now -She previously described occasional dysphagia, neck pressure, and shortness of breath when turning head and raising arms, but these improved  3.  Prediabetes -Not on medication for this -HbA1c checked at last visit was 5.3%, decreased from 5.9% -We discussed about the risk of diabetes in patients with prediabetes at last visit.  We also discussed that prediabetes is reversible with weight loss and improved diet.  She does started working with a nutritionist before our last visit. -will recheck her HbA1c today  Component     Latest Ref Rng 03/20/2023  T4,Free(Direct)     0.60 - 1.60 ng/dL 1.61   TSH     0.96 - 0.45 uIU/mL 0.53   Hemoglobin A1C     4.6 - 6.5 % 5.5   HbA1c is normal.  TFTs are also normal.   Thyroid U/S (03/28/2023): Parenchymal Echotexture: Moderately heterogenous  Isthmus: 0.3 cm  Right lobe: 5.1 x 1.4 x 1.6 cm  Left lobe: 5.4 x 1.5 x 1.2 cm  _________________________________________________________   Estimated total number of nodules >/= 1 cm: 2 _________________________________________________________   Nodule 1: 1.6 x 1.4 x 1.3 cm right mid thyroid nodule is unchanged in size since prior  examination. Please correlate with prior FNA results from 03/18/2020. ____________________________________________________   Nodule 2: 2.3 x 1.4 x 0.9 cm left mid thyroid nodule/pseudonodule does not appear significantly changed in size when compared to the examination from 06/28/2017 where it measured 2.6 x 0.9 x 1.1 cm. Stability since 2018 indicates a benign etiology.    IMPRESSION: 1. Previously biopsied right mid thyroid nodule is unchanged in size. Please correlate with prior FNA results from 03/18/2020. 2. Left mid thyroid nodule/pseudonodule is not significantly changed in size when compared to ultrasound from 06/28/2017 which indicates a benign etiology. 3. No new thyroid nodules.  Carlus Pavlov, MD PhD [facility] End[facility]

## 2023-03-28 ENCOUNTER — Ambulatory Visit
Admission: RE | Admit: 2023-03-28 | Discharge: 2023-03-28 | Disposition: A | Discharge status: Home / Self Care | Payer: 59 | Source: Ambulatory Visit | Attending: Internal Medicine | Admitting: Internal Medicine

## 2023-03-28 DIAGNOSIS — E041 Nontoxic single thyroid nodule: Secondary | ICD-10-CM

## 2023-06-19 ENCOUNTER — Other Ambulatory Visit: Payer: Self-pay | Admitting: Internal Medicine

## 2023-06-19 DIAGNOSIS — E039 Hypothyroidism, unspecified: Secondary | ICD-10-CM

## 2023-06-20 ENCOUNTER — Other Ambulatory Visit: Payer: Self-pay | Admitting: Internal Medicine

## 2023-06-20 DIAGNOSIS — E039 Hypothyroidism, unspecified: Secondary | ICD-10-CM

## 2023-09-14 ENCOUNTER — Other Ambulatory Visit (HOSPITAL_BASED_OUTPATIENT_CLINIC_OR_DEPARTMENT_OTHER): Payer: Self-pay

## 2023-09-15 ENCOUNTER — Other Ambulatory Visit (HOSPITAL_COMMUNITY): Payer: Self-pay

## 2023-09-15 ENCOUNTER — Other Ambulatory Visit (HOSPITAL_BASED_OUTPATIENT_CLINIC_OR_DEPARTMENT_OTHER): Payer: Self-pay

## 2023-09-15 MED ORDER — TIRZEPATIDE-WEIGHT MANAGEMENT 10 MG/0.5ML ~~LOC~~ SOAJ
10.0000 mg | SUBCUTANEOUS | 0 refills | Status: AC
  Filled 2023-09-15: qty 2, 28d supply, fill #0

## 2023-09-18 ENCOUNTER — Other Ambulatory Visit (HOSPITAL_COMMUNITY): Payer: Self-pay

## 2023-09-20 ENCOUNTER — Ambulatory Visit (INDEPENDENT_AMBULATORY_CARE_PROVIDER_SITE_OTHER): Payer: 59 | Admitting: Internal Medicine

## 2023-09-20 VITALS — BP 136/80 | HR 83 | Ht 67.5 in | Wt 237.4 lb

## 2023-09-20 DIAGNOSIS — E039 Hypothyroidism, unspecified: Secondary | ICD-10-CM

## 2023-09-20 DIAGNOSIS — E041 Nontoxic single thyroid nodule: Secondary | ICD-10-CM | POA: Diagnosis not present

## 2023-09-20 DIAGNOSIS — R7303 Prediabetes: Secondary | ICD-10-CM

## 2023-09-20 NOTE — Patient Instructions (Addendum)
Please continue Synthroid  225 mcg daily.  Take the thyroid hormone every day, with water, at least 30 minutes before breakfast, separated by at least 4 hours from: - acid reflux medications - calcium - iron - multivitamins  Please stop at the lab.  Please return in 1 year.

## 2023-09-20 NOTE — Progress Notes (Signed)
Patient ID: Natalie Camacho, female   DOB: [birth year], 57 y.o.   MRN: [id number]   HPI  Natalie Camacho is a 57 y.o.-year-old female, presenting for follow-up for hypothyroidism, thyroid cyst, and prediabetes.  Last visit 6 months ago.  Interim history: No increased urination, blurry vision, nausea, chest pain. Before last visit, she had a spinal cord stimulator for pain in R leg - CRPS -this is working well. She is undergoing treatment for dry eyes.  Reviewed and addended history: Pt. has been dx with goiter and hypothyroidism in 1992 >> on high doses of levothyroxine, dose increased to 225 mcg daily in 07/2019.  TSH normalized on this dose in 09/2019.  In 07/2019, LT4 dose was increased to 225 mcg daily  In 09/2019, TSH was normal on this dose  In 04/2020, TSH was suppressed, so we decreased LT4 to 200 mcg daily  In 07/2020, TSH is very high but we did not change the dose suspecting noncompliance.  In 09/2020, TSH was normal.  In 11/2020, a TSH checked by PCP was higher, at 8.39.  In 12/2020, TSH was again slightly high, and I advised her to start crushing the LT4 200 mcg tablet  In 03/2021, we moved omeprazole later in the day (stopped since) and increased the dose of  LT4 to 225 mcg daily   Patient takes her Synthroid DAW: - misses doses - in am (around 5:30 AM) - fasting - at least 30 min from b'fast - no Ca, Fe - prev. Omeprazole (Nexium) x 1 week (>2h after LT4) >> now 5x a week - >4h later - stopped Multivitamins at night - not on Biotin  Reviewed her TFTs: Lab Results  Component Value Date   TSH 0.53 03/20/2023   TSH 0.71 07/17/2022   TSH 0.42 03/09/2022   TSH 5.01 09/08/2021   TSH 3.81 05/02/2021   TSH 14.84 (H) 03/07/2021   TSH 7.22 (H) 12/31/2020   TSH 3.95 09/16/2020   TSH 20.87 (H) 07/27/2020   TSH 0.28 (L) 04/21/2020   FREET4 1.23 03/20/2023   FREET4 1.28 07/17/2022   FREET4 1.34 03/09/2022   FREET4 0.86 09/08/2021   FREET4 0.94 05/02/2021    FREET4 0.92 03/07/2021   FREET4 1.09 12/31/2020   FREET4 0.91 09/16/2020   FREET4 0.74 07/27/2020   FREET4 1.14 04/21/2020  11/11/2020: TSH 8.39 05/2019: Reported TSH 11.58. 05/06/2018: TSH 0.57 10/08/2017: TSH 0.22 - on biotin at the time 08/10/2017: TSH 0.85, free T4 1.03 07/09/2017: TSH 8.70, free T4 0.76 06/14/2017: TSH 5.51, free T4, 0.65 12/26/2016: TSH 3.08, free T4 0.8   Thyroid ultrasound 06/29/2017: 0.9 cm small cystic nodule in the right inferior thyroid lobe  Thyroid ultrasound (03/16/2020): Parenchymal Echotexture: Choose 4 Isthmus: 3 mm Right lobe: 3.6 x 1.2 x 1.5 cm Left lobe: 4.7 x 1.1 x 1.2 cm _________________________________________________________   Nodule # 1: Location: Right; Mid Maximum size: 1.7 cm; Other 2 dimensions: 1.2 x 1.2 cm Composition: solid/almost completely solid (2) Echogenicity: hypoechoic (2) Echogenic foci: macrocalcifications (1) **Given size (>/= 1.5 cm) and appearance, fine needle aspiration of this moderately suspicious nodule should be considered based on TI-RADS criteria. _____________________________________________________   No significant left thyroid abnormality or nodule. No hypervascularity or regional adenopathy.   IMPRESSION: 1.7 cm right mid thyroid TR 4 nodule meets criteria for biopsy as above.  FNA (03/18/2020): Atypia of unknown significance Afirma molecular marker: Benign  Thyroid U/S (03/22/2022): Parenchymal Echotexture: Markedly heterogenous  Isthmus: 0.4 cm Right lobe: 3.4 cm  x 1.5 cm x 2.3 cm  Left lobe: 3.6 cm x 1.3 cm x 1.2 cm  _________________________________________________________   Estimated total number of nodules >/= 1 cm: 2 ________________________________________________________   Nodule labeled 1, mid right thyroid, 1.4 cm, previously 1.7 cm. This nodule has undergone biopsy 03/18/2020. Assuming benign result, no further specific follow-up would be indicated.   Nodule labeled 2, left mid  thyroid. The appearance is similar to the comparison ultrasound survey of 03/16/2020, with overall heterogeneous thyroid tissue in this location, such as the appearance on image 32/42 of the prior. If this is a nodule and not a pseudo nodule, this would be a TR 3 nodule, given the overall appearance in the remainder of the gland.   No adenopathy   IMPRESSION: Similar appearance of atrophic heterogeneous thyroid suggesting medical thyroid disease.   Assuming benign result of the prior right mid thyroid biopsy, no further specific follow-up would be indicated.   Pseudo nodule versus TR 3 nodule in the left thyroid lobe. Surveillance strategy may be initiated, as designated by the newly established ACR TI-RADS criteria. Surveillance ultrasound study recommended to be performed annually up to 5 years.  Thyroid U/S (03/28/2023): Parenchymal Echotexture: Moderately heterogenous  Isthmus: 0.3 cm  Right lobe: 5.1 x 1.4 x 1.6 cm  Left lobe: 5.4 x 1.5 x 1.2 cm  _________________________________________________________   Estimated total number of nodules >/= 1 cm: 2 _________________________________________________________   Nodule 1: 1.6 x 1.4 x 1.3 cm right mid thyroid nodule is unchanged in size since prior examination. Please correlate with prior FNA results from 03/18/2020. ____________________________________________________   Nodule 2: 2.3 x 1.4 x 0.9 cm left mid thyroid nodule/pseudonodule does not appear significantly changed in size when compared to the examination from 06/28/2017 where it measured 2.6 x 0.9 x 1.1 cm. Stability since 2018 indicates a benign etiology.   IMPRESSION: 1. Previously biopsied right mid thyroid nodule is unchanged in size. Please correlate with prior FNA results from 03/18/2020. 2. Left mid thyroid nodule/pseudonodule is not significantly changed in size when compared to ultrasound from 06/28/2017 which indicates a benign etiology. 3. No new  thyroid nodules.  Pt mentions: - no feeling nodules in neck + hoarseness + occasional dysphagia-a little worse - dry throat She has OSA-on CPAP.  She has + FH of thyroid disorders in: hypothyroidism in mother and sister. No FH of thyroid cancer. No h/o radiation tx to head or neck. No herbal supplements. No Biotin use. No recent steroids use.   Prediabetes: She just started Zepbound 1 week ago.  Reviewed HbA1c levels: Lab Results  Component Value Date   HGBA1C 5.5 03/20/2023  11/11/2020: HbA1c 5.7%. 11/10/2019: HbA1c 6.3%.   She has menopausal hot flashes>> better on estradiol. Pt. also has a history of GERD, iron deficiency anemia, asthma (on Albuterol).   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    ROS: + See HPI  I reviewed pt's medications, allergies, PMH, social hx, family hx, and changes were documented in the history of present illness. Otherwise, unchanged from my initial visit note.  Past Medical History:  Diagnosis Date   Anemia    Anxiety    Asthma    Complex regional pain syndrome type II    Depression    GERD (gastroesophageal reflux disease)    Headache(784.0)    Heart murmur    Hypertension    Hypothyroidism    PONV (postoperative nausea and vomiting)    Sleep apnea  Past Surgical History:  Procedure Laterality Date   ABDOMINOPLASTY     CARPAL TUNNEL RELEASE     FRACTURE SURGERY     ORIF rt ankle, tendon sheath removed   fundal     fundal plication   HERNIA REPAIR  2001   NOSE SURGERY     RECTOCELE REPAIR     VENTRAL HERNIA REPAIR N/A 10/06/2020   Procedure: VENTRAL INCISIONAL HERNIA REPAIR WITH MESH;  Surgeon: Manus Rudd, MD;  Location: [facility];  Service: General;  Laterality: N/A;  LMA   Social History   Socioeconomic History   Marital status: Married    Spouse name: Not on file   Number of children: 1  Social Needs  Occupational History    Retired  Tobacco Use   Smoking status: Never Smoker    Smokeless tobacco: Never Used  Substance and Sexual Activity   Alcohol use:  Liquor 3 times a year, 1-2 drinks   Drug use: No   Current Outpatient Medications on File Prior to Visit  Medication Sig Dispense Refill   acetaminophen (TYLENOL) 500 MG tablet Take 1,000 mg by mouth every 6 (six) hours as needed for headache (pain).     albuterol (VENTOLIN HFA) 108 (90 Base) MCG/ACT inhaler Inhale 2 puffs into the lungs every 6 (six) hours as needed for wheezing or shortness of breath.     brexpiprazole (REXULTI) 1 MG TABS tablet Take 1 mg by mouth daily.     buPROPion (WELLBUTRIN XL) 300 MG 24 hr tablet Take 300 mg by mouth daily.     Cholecalciferol (VITAMIN D-3) 5000 units TABS Take 5,000 Units by mouth daily.     DULoxetine (CYMBALTA) 60 MG capsule Take 120 mg by mouth daily.     Estradiol-Norethindrone Acet 0.5-0.1 MG tablet Take 1 tablet by mouth daily.     hydrochlorothiazide (HYDRODIURIL) 25 MG tablet Take 25 mg by mouth daily.     HYDROcodone-acetaminophen (NORCO/VICODIN) 5-325 MG tablet Take 1 tablet by mouth every 6 (six) hours as needed for moderate pain. 15 tablet 0   hydroxypropyl methylcellulose / hypromellose (ISOPTO TEARS / GONIOVISC) 2.5 % ophthalmic solution Place 1 drop into the left eye 4 (four) times daily as needed for dry eyes. 15 mL 12   ibuprofen (ADVIL,MOTRIN) 200 MG tablet Take 400-600 mg by mouth every 6 (six) hours as needed for headache (pain).     loratadine (CLARITIN) 10 MG tablet Take 10 mg by mouth daily as needed (pet allergies).     modafinil (PROVIGIL) 200 MG tablet Take 200 mg by mouth 2 (two) times daily. 1 in the morning and  Half in the afternoon.     Multiple Vitamin (MULTIVITAMIN WITH MINERALS) TABS tablet Take 1 tablet by mouth at bedtime.     NITROFURANTOIN PO Take 100 mg by mouth daily.     Polyethyl Glycol-Propyl Glycol (SYSTANE OP) Place 1 drop into both eyes daily as needed (dry eyes/ irritation).     potassium chloride SA (K-DUR,KLOR-CON) 20 MEQ  tablet Take 20 mEq by mouth daily.     PRESCRIPTION MEDICATION Inhale into the lungs at bedtime. CPAP     rOPINIRole (REQUIP) 3 MG tablet Take 3 mg by mouth at bedtime.     simvastatin (ZOCOR) 20 MG tablet Take 20 mg by mouth at bedtime.     SYNTHROID 200 MCG tablet TAKE 1 TABLET BY MOUTH DAILY  BEFORE BREAKFAST 90 tablet 3   SYNTHROID 25 MCG tablet TAKE 1  TABLET BY MOUTH DAILY  BEFORE BREAKFAST 90 tablet 3   tirzepatide (ZEPBOUND) 10 MG/0.5ML Pen Inject 10 mg into the skin once a week. 2 mL 0   topiramate (TOPAMAX) 100 MG tablet Take 200 mg by mouth 2 (two) times daily.      valACYclovir (VALTREX) 1000 MG tablet Take 1 tablet (1,000 mg total) by mouth 3 (three) times daily. 21 tablet 0   zolpidem (AMBIEN) 10 MG tablet Take 10 mg by mouth at bedtime.     No current facility-administered medications on file prior to visit.   Allergies  Allergen Reactions   Accolate [Zafirlukast] Hives   Aspirin Other (See Comments)    wheezing   Augmentin [Amoxicillin-Pot Clavulanate] Hives and Nausea And Vomiting   Codeine Nausea And Vomiting   Erythromycin Nausea And Vomiting   Lyrica [Pregabalin] Other (See Comments)    Suicidal thoughts    Peanut-Containing Drug Products Other (See Comments)    Wheezing    Family History  Problem Relation Age of Onset   Hypothyroidism Mother    Diabetes Father   Also,  heart disease in grandmother  diabetes in sister Lung cancer in grandfather  PE: BP 136/80   Pulse 83   Ht 5' 7.5" (1.715 m)   Wt 237 lb 6.4 oz (107.7 kg)   SpO2 97%   BMI 36.63 kg/m  Wt Readings from Last 3 Encounters:  09/20/23 237 lb 6.4 oz (107.7 kg)  03/20/23 231 lb 12.8 oz (105.1 kg)  03/09/22 242 lb 3.2 oz (109.9 kg)   Constitutional: in NAD, + full supraclavicular fat pads Eyes: PERRLA, EOMI, no exophthalmos ENT: no thyromegaly, no cervical lymphadenopathy Cardiovascular: RRR, No MRG Respiratory: CTA B Musculoskeletal: no deformities Skin: no rashes Neurological: +  tremor with outstretched hands  ASSESSMENT: 1. Hypothyroidism due to Hashimoto's thyroiditis.  2.  Thyroid nodules  3.  Prediabetes  PLAN:  1. Patient with longstanding autoimmune hypothyroidism, on levothyroxine therapy - latest thyroid labs reviewed with pt. >> normal: Lab Results  Component Value Date   TSH 0.53 03/20/2023  - she continues on LT4 225 mcg daily (she tried crushing the tablet but the TSH was higher) - pt feels good on this dose.  At last visit she complained of weight gain.  She initially lost 30 pounds but then gained 20 back. - we discussed about taking the thyroid hormone every day, with water, >30 minutes before breakfast, separated by >4 hours from acid reflux medications, calcium, iron, multivitamins. Pt. is taking it correctly. - will check thyroid tests today: TSH and fT4 - If labs are abnormal, she will need to return for repeat TFTs in 1.5 months - We discussed that if she loses more than 10 pounds on Mounjaro, we will need to repeat her thyroid tests.  She will let me know. -Otherwise, I will see her back in a year  2.  Thyroid nodules -Per review of the thyroid ultrasound report from 03/2020, she had a 1.7 cm right mid nodule, possibly developed from a previous cyst.  This was biopsied with inconclusive results (AUS) in 03/2020, however, the Afirma molecular marker returned benign. Another ultrasound (03/2022) showed a 1.7 cm right nodule that decreased in size to 1.4 cm, with no further follow-up recommended.  However, she also had a left thyroid nodule (or pseudonodule), for which follow-up was recommended for 5 years. -At last visit, we checked another thyroid ultrasound (03/28/2023) showed stability of the nodules but still recommended follow-up in a year  for the left thyroid nodule -She previous describes occasional dysphagia, neck pressure, shortness of breath when turning head and raising arms, but this is improved  3.  Prediabetes -She was not on  medication for this, but she recently started Zepbound for weight loss.  She tolerates it well. -HbA1c was slightly higher at last check, increased from 5.3% to 5.5%, but still within the normal range. -We did just gust at previous visits that prediabetes is reversible with weight loss and improve diet.  She then started to work with a nutritionist -Will recheck her HbA1c today  Component     Latest Ref Rng 09/20/2023  T4,Free(Direct)     0.8 - 1.8 ng/dL 1.6   TSH     mIU/L 3.24 (L)   TSH is suppressed.  Will back off the levothyroxine dose to 200 mcg daily and recheck the test in 1.5 months. HbA1c was not drawn at today's visit so we will order this for the next lab draw.  Carlus Pavlov, MD PhD [facility]

## 2023-09-21 ENCOUNTER — Other Ambulatory Visit (HOSPITAL_COMMUNITY): Payer: Self-pay

## 2023-09-21 ENCOUNTER — Encounter: Payer: Self-pay | Admitting: Internal Medicine

## 2023-09-21 LAB — TSH: TSH: 0.35 m[IU]/L — ABNORMAL LOW

## 2023-09-21 LAB — T4, FREE: Free T4: 1.6 ng/dL (ref 0.8–1.8)

## 2023-10-01 ENCOUNTER — Other Ambulatory Visit (HOSPITAL_COMMUNITY): Payer: Self-pay

## 2023-10-01 MED ORDER — ZYLET 0.5-0.3 % OP SUSP
1.0000 [drp] | Freq: Two times a day (BID) | OPHTHALMIC | 0 refills | Status: DC
  Filled 2023-10-01: qty 5, 30d supply, fill #0

## 2023-10-02 ENCOUNTER — Other Ambulatory Visit (HOSPITAL_COMMUNITY): Payer: Self-pay

## 2023-10-02 MED ORDER — ZYLET 0.5-0.3 % OP SUSP
OPHTHALMIC | 0 refills | Status: AC
  Filled 2023-10-02: qty 5, 25d supply, fill #0

## 2023-10-04 ENCOUNTER — Other Ambulatory Visit (HOSPITAL_COMMUNITY): Payer: Self-pay

## 2023-10-15 ENCOUNTER — Other Ambulatory Visit (HOSPITAL_BASED_OUTPATIENT_CLINIC_OR_DEPARTMENT_OTHER): Payer: Self-pay

## 2023-10-15 MED ORDER — ZEPBOUND 12.5 MG/0.5ML ~~LOC~~ SOAJ
12.5000 mg | SUBCUTANEOUS | 0 refills | Status: AC
  Filled 2023-10-15: qty 2, 28d supply, fill #0

## 2023-10-16 ENCOUNTER — Other Ambulatory Visit: Payer: Self-pay

## 2023-10-16 ENCOUNTER — Other Ambulatory Visit (HOSPITAL_BASED_OUTPATIENT_CLINIC_OR_DEPARTMENT_OTHER): Payer: Self-pay

## 2023-10-16 MED ORDER — VALACYCLOVIR HCL 1 G PO TABS
1000.0000 mg | ORAL_TABLET | Freq: Three times a day (TID) | ORAL | 0 refills | Status: AC
  Filled 2023-10-16 (×2): qty 30, 10d supply, fill #0
  Filled 2023-11-13: qty 12, 4d supply, fill #1

## 2023-10-22 ENCOUNTER — Other Ambulatory Visit (HOSPITAL_BASED_OUTPATIENT_CLINIC_OR_DEPARTMENT_OTHER): Payer: Self-pay

## 2023-11-13 ENCOUNTER — Other Ambulatory Visit (HOSPITAL_BASED_OUTPATIENT_CLINIC_OR_DEPARTMENT_OTHER): Payer: Self-pay

## 2023-11-13 MED ORDER — ZEPBOUND 15 MG/0.5ML ~~LOC~~ SOAJ
15.0000 mg | SUBCUTANEOUS | 0 refills | Status: DC
  Filled 2023-11-13: qty 2, 28d supply, fill #0
  Filled 2024-03-04: qty 2, 28d supply, fill #1
  Filled 2024-04-02: qty 2, 28d supply, fill #2

## 2023-12-12 ENCOUNTER — Other Ambulatory Visit (HOSPITAL_BASED_OUTPATIENT_CLINIC_OR_DEPARTMENT_OTHER): Payer: Self-pay

## 2023-12-12 MED ORDER — ZEPBOUND 15 MG/0.5ML ~~LOC~~ SOAJ
15.0000 mg | SUBCUTANEOUS | 0 refills | Status: AC
  Filled 2023-12-12: qty 2, 28d supply, fill #0
  Filled 2024-01-09: qty 2, 28d supply, fill #1
  Filled 2024-02-03: qty 2, 28d supply, fill #2

## 2023-12-13 ENCOUNTER — Other Ambulatory Visit (HOSPITAL_BASED_OUTPATIENT_CLINIC_OR_DEPARTMENT_OTHER): Payer: Self-pay

## 2024-01-09 ENCOUNTER — Other Ambulatory Visit (HOSPITAL_BASED_OUTPATIENT_CLINIC_OR_DEPARTMENT_OTHER): Payer: Self-pay

## 2024-03-04 ENCOUNTER — Other Ambulatory Visit (HOSPITAL_BASED_OUTPATIENT_CLINIC_OR_DEPARTMENT_OTHER): Payer: Self-pay

## 2024-04-30 ENCOUNTER — Other Ambulatory Visit (HOSPITAL_BASED_OUTPATIENT_CLINIC_OR_DEPARTMENT_OTHER): Payer: Self-pay

## 2024-04-30 MED ORDER — ZEPBOUND 15 MG/0.5ML ~~LOC~~ SOAJ
15.0000 mg | SUBCUTANEOUS | 3 refills | Status: DC
  Filled 2024-04-30: qty 2, 28d supply, fill #0
  Filled 2024-06-03: qty 2, 28d supply, fill #1
  Filled 2024-06-30: qty 2, 28d supply, fill #2
  Filled 2024-07-31: qty 2, 28d supply, fill #3

## 2024-05-03 ENCOUNTER — Other Ambulatory Visit (HOSPITAL_BASED_OUTPATIENT_CLINIC_OR_DEPARTMENT_OTHER): Payer: Self-pay

## 2024-05-13 ENCOUNTER — Other Ambulatory Visit: Payer: Self-pay | Admitting: Internal Medicine

## 2024-05-13 DIAGNOSIS — E039 Hypothyroidism, unspecified: Secondary | ICD-10-CM

## 2024-05-30 ENCOUNTER — Other Ambulatory Visit: Payer: Self-pay | Admitting: Internal Medicine

## 2024-05-30 DIAGNOSIS — E039 Hypothyroidism, unspecified: Secondary | ICD-10-CM

## 2024-06-03 ENCOUNTER — Other Ambulatory Visit (HOSPITAL_BASED_OUTPATIENT_CLINIC_OR_DEPARTMENT_OTHER): Payer: Self-pay

## 2024-06-10 ENCOUNTER — Other Ambulatory Visit (HOSPITAL_BASED_OUTPATIENT_CLINIC_OR_DEPARTMENT_OTHER): Payer: Self-pay

## 2024-06-10 MED ORDER — OXYCODONE HCL 5 MG PO TABS
5.0000 mg | ORAL_TABLET | Freq: Four times a day (QID) | ORAL | 0 refills | Status: AC | PRN
  Filled 2024-06-10: qty 12, 3d supply, fill #0

## 2024-06-10 MED ORDER — CLINDAMYCIN HCL 300 MG PO CAPS
300.0000 mg | ORAL_CAPSULE | Freq: Three times a day (TID) | ORAL | 0 refills | Status: AC
  Filled 2024-06-10: qty 30, 10d supply, fill #0

## 2024-06-30 ENCOUNTER — Other Ambulatory Visit (HOSPITAL_BASED_OUTPATIENT_CLINIC_OR_DEPARTMENT_OTHER): Payer: Self-pay

## 2024-06-30 MED ORDER — LISDEXAMFETAMINE DIMESYLATE 10 MG PO CAPS
10.0000 mg | ORAL_CAPSULE | Freq: Every day | ORAL | 0 refills | Status: AC
  Filled 2024-06-30: qty 30, 30d supply, fill #0

## 2024-07-01 ENCOUNTER — Other Ambulatory Visit (HOSPITAL_BASED_OUTPATIENT_CLINIC_OR_DEPARTMENT_OTHER): Payer: Self-pay

## 2024-07-28 ENCOUNTER — Other Ambulatory Visit (HOSPITAL_BASED_OUTPATIENT_CLINIC_OR_DEPARTMENT_OTHER): Payer: Self-pay

## 2024-07-28 MED ORDER — LISDEXAMFETAMINE DIMESYLATE 40 MG PO CAPS
40.0000 mg | ORAL_CAPSULE | Freq: Every day | ORAL | 0 refills | Status: DC
  Filled 2024-07-28: qty 30, 30d supply, fill #0

## 2024-08-01 ENCOUNTER — Other Ambulatory Visit (HOSPITAL_BASED_OUTPATIENT_CLINIC_OR_DEPARTMENT_OTHER): Payer: Self-pay

## 2024-08-25 ENCOUNTER — Other Ambulatory Visit (HOSPITAL_BASED_OUTPATIENT_CLINIC_OR_DEPARTMENT_OTHER): Payer: Self-pay

## 2024-08-26 ENCOUNTER — Other Ambulatory Visit (HOSPITAL_BASED_OUTPATIENT_CLINIC_OR_DEPARTMENT_OTHER): Payer: Self-pay

## 2024-08-26 MED ORDER — ZEPBOUND 15 MG/0.5ML ~~LOC~~ SOAJ
15.0000 mg | SUBCUTANEOUS | 3 refills | Status: AC
  Filled 2024-08-26: qty 2, 28d supply, fill #0
  Filled 2024-09-24 (×2): qty 2, 28d supply, fill #1

## 2024-09-03 ENCOUNTER — Other Ambulatory Visit (HOSPITAL_BASED_OUTPATIENT_CLINIC_OR_DEPARTMENT_OTHER): Payer: Self-pay

## 2024-09-04 ENCOUNTER — Other Ambulatory Visit (HOSPITAL_BASED_OUTPATIENT_CLINIC_OR_DEPARTMENT_OTHER): Payer: Self-pay

## 2024-09-04 MED ORDER — LISDEXAMFETAMINE DIMESYLATE 40 MG PO CAPS
40.0000 mg | ORAL_CAPSULE | Freq: Every day | ORAL | 0 refills | Status: AC
  Filled 2024-09-04: qty 30, 30d supply, fill #0

## 2024-09-19 ENCOUNTER — Ambulatory Visit: Payer: 59 | Admitting: Internal Medicine

## 2024-09-19 ENCOUNTER — Other Ambulatory Visit

## 2024-09-19 VITALS — BP 118/68 | HR 72 | Ht 67.5 in | Wt 181.6 lb

## 2024-09-19 DIAGNOSIS — E041 Nontoxic single thyroid nodule: Secondary | ICD-10-CM

## 2024-09-19 DIAGNOSIS — R7303 Prediabetes: Secondary | ICD-10-CM | POA: Diagnosis not present

## 2024-09-19 DIAGNOSIS — E039 Hypothyroidism, unspecified: Secondary | ICD-10-CM | POA: Diagnosis not present

## 2024-09-19 NOTE — Progress Notes (Addendum)
 Patient ID: Natalie Camacho, female   DOB: 1966/08/09, 58 y.o.   MRN: [id number]   HPI  Natalie Camacho is a 58 y.o.-year-old female, presenting for follow-up for hypothyroidism, thyroid  cyst, and prediabetes.  Last visit 6 months ago.  Interim history: No increased urination, blurry vision, nausea, chest pain. Since last visit, she lost approximately 60 pounds, while on Zepbound .  She does not have any GI symptoms with that.  She feels much better.  Reviewed and addended history: Pt. has been dx with goiter and hypothyroidism in 1992 >> on high doses of levothyroxine , dose increased to 225 mcg daily in 07/2019.  TSH normalized on this dose in 09/2019.  In 07/2019, LT4 dose was increased to 225 mcg daily  In 09/2019, TSH was normal on this dose  In 04/2020, TSH was suppressed, so we decreased LT4 to 200 mcg daily  In 07/2020, TSH is very high but we did not change the dose suspecting noncompliance.  In 09/2020, TSH was normal.  In 11/2020, a TSH checked by PCP was higher, at 8.39.  In 12/2020, TSH was again slightly high, and I advised her to start crushing the LT4 200 mcg tablet  In 03/2021, we moved omeprazole later in the day (stopped since) and increased the dose of  LT4 to 225 mcg daily  In 09/2023, we decreased LT4 dose to 200 mcg daily.  Patient takes her Synthroid  DAW: - misses doses - in am (around 5:30 AM) - fasting - at least 30 min from b'fast - no Ca, Fe - + Omeprazole (Nexium) >4h later - + restarted Multivitamins at night - not on Biotin  Reviewed her TFTs: 02/14/2024: TSH 0.45 (0.34-4.5) Lab Results  Component Value Date   TSH 0.35 (L) 09/20/2023   TSH 0.53 03/20/2023   TSH 0.71 07/17/2022   TSH 0.42 03/09/2022   TSH 5.01 09/08/2021   TSH 3.81 05/02/2021   TSH 14.84 (H) 03/07/2021   TSH 7.22 (H) 12/31/2020   TSH 3.95 09/16/2020   TSH 20.87 (H) 07/27/2020   FREET4 1.6 09/20/2023   FREET4 1.23 03/20/2023   FREET4 1.28 07/17/2022   FREET4 1.34  03/09/2022   FREET4 0.86 09/08/2021   FREET4 0.94 05/02/2021   FREET4 0.92 03/07/2021   FREET4 1.09 12/31/2020   FREET4 0.91 09/16/2020   FREET4 0.74 07/27/2020  11/11/2020: TSH 8.39 05/2019: Reported TSH 11.58. 05/06/2018: TSH 0.57 10/08/2017: TSH 0.22 - on biotin at the time 08/10/2017: TSH 0.85, free T4 1.03 07/09/2017: TSH 8.70, free T4 0.76 06/14/2017: TSH 5.51, free T4, 0.65 12/26/2016: TSH 3.08, free T4 0.8   Thyroid  ultrasound 06/29/2017: 0.9 cm small cystic nodule in the right inferior thyroid  lobe  Thyroid  ultrasound (03/16/2020): Parenchymal Echotexture: Choose 4 Isthmus: 3 mm Right lobe: 3.6 x 1.2 x 1.5 cm Left lobe: 4.7 x 1.1 x 1.2 cm _________________________________________________________   Nodule # 1: Location: Right; Mid Maximum size: 1.7 cm; Other 2 dimensions: 1.2 x 1.2 cm Composition: solid/almost completely solid (2) Echogenicity: hypoechoic (2) Echogenic foci: macrocalcifications (1) **Given size (>/= 1.5 cm) and appearance, fine needle aspiration of this moderately suspicious nodule should be considered based on TI-RADS criteria. _____________________________________________________   No significant left thyroid  abnormality or nodule. No hypervascularity or regional adenopathy.   IMPRESSION: 1.7 cm right mid thyroid  TR 4 nodule meets criteria for biopsy as above.  FNA (03/18/2020): Atypia of unknown significance Afirma molecular marker: Benign  Thyroid  U/S (03/22/2022): Parenchymal Echotexture: Markedly heterogenous  Isthmus: 0.4 cm Right lobe:  3.4 cm x 1.5 cm x 2.3 cm  Left lobe: 3.6 cm x 1.3 cm x 1.2 cm  _________________________________________________________   Estimated total number of nodules >/= 1 cm: 2 ________________________________________________________   Nodule labeled 1, mid right thyroid , 1.4 cm, previously 1.7 cm. This nodule has undergone biopsy 03/18/2020. Assuming benign result, no further specific follow-up would be  indicated.   Nodule labeled 2, left mid thyroid . The appearance is similar to the comparison ultrasound survey of 03/16/2020, with overall heterogeneous thyroid  tissue in this location, such as the appearance on image 32/42 of the prior. If this is a nodule and not a pseudo nodule, this would be a TR 3 nodule, given the overall appearance in the remainder of the gland.   No adenopathy   IMPRESSION: Similar appearance of atrophic heterogeneous thyroid  suggesting medical thyroid  disease.   Assuming benign result of the prior right mid thyroid  biopsy, no further specific follow-up would be indicated.   Pseudo nodule versus TR 3 nodule in the left thyroid  lobe. Surveillance strategy may be initiated, as designated by the newly established ACR TI-RADS criteria. Surveillance ultrasound study recommended to be performed annually up to 5 years.  Thyroid  U/S (03/28/2023): Parenchymal Echotexture: Moderately heterogenous  Isthmus: 0.3 cm  Right lobe: 5.1 x 1.4 x 1.6 cm  Left lobe: 5.4 x 1.5 x 1.2 cm  _________________________________________________________   Estimated total number of nodules >/= 1 cm: 2 _________________________________________________________   Nodule 1: 1.6 x 1.4 x 1.3 cm right mid thyroid  nodule is unchanged in size since prior examination. Please correlate with prior FNA results from 03/18/2020. ____________________________________________________   Nodule 2: 2.3 x 1.4 x 0.9 cm left mid thyroid  nodule/pseudonodule does not appear significantly changed in size when compared to the examination from 06/28/2017 where it measured 2.6 x 0.9 x 1.1 cm. Stability since 2018 indicates a benign etiology.   IMPRESSION: 1. Previously biopsied right mid thyroid  nodule is unchanged in size. Please correlate with prior FNA results from 03/18/2020. 2. Left mid thyroid  nodule/pseudonodule is not significantly changed in size when compared to ultrasound from 06/28/2017 which  indicates a benign etiology. 3. No new thyroid  nodules.  Pt mentions: - no feeling nodules in neck + hoarseness + occasional dysphagia-a little worse - dry throat She has OSA-on CPAP.  She has + FH of thyroid  disorders in: hypothyroidism in mother and sister. No FH of thyroid  cancer. No h/o radiation tx to head or neck. No herbal supplements. No Biotin use. No recent steroids use.   Prediabetes: She started Zepbound  1 week prior to our last appointment a year ago.  She continues on this - on 15 mg weekly.  She is now on the maximal dose.  Reviewed HbA1c levels: 02/14/2024: HbA1c 5.0% Lab Results  Component Value Date   HGBA1C 5.5 03/20/2023  11/11/2020: HbA1c 5.7%. 11/10/2019: HbA1c 6.3%.   She has menopausal hot flashes>> better on estradiol. Pt. also has a history of GERD, iron deficiency anemia, asthma (on Albuterol ).   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   ROS: + See HPI  I reviewed pt's medications, allergies, PMH, social hx, family hx, and changes were documented in the history of present illness. Otherwise, unchanged from my initial visit note.  Past Medical History:  Diagnosis Date   Anemia    Anxiety    Asthma    Complex regional pain syndrome  type II    Depression    GERD (gastroesophageal reflux disease)    Headache(784.0)    Heart murmur    Hypertension    Hypothyroidism    PONV (postoperative nausea and vomiting)    Sleep apnea    Past Surgical History:  Procedure Laterality Date   ABDOMINOPLASTY     CARPAL TUNNEL RELEASE     FRACTURE SURGERY     ORIF rt ankle, tendon sheath removed   fundal     fundal plication   HERNIA REPAIR  2001   NOSE SURGERY     RECTOCELE REPAIR     VENTRAL HERNIA REPAIR N/A 10/06/2020   Procedure: VENTRAL INCISIONAL HERNIA REPAIR WITH MESH;  Surgeon: Belinda Cough, MD;  Location: [facility];  Service: General;   Laterality: N/A;  LMA   Social History   Socioeconomic History   Marital status: Married    Spouse name: Not on file   Number of children: 1  Social Needs  Occupational History    Retired  Tobacco Use   Smoking status: Never Smoker   Smokeless tobacco: Never Used  Substance and Sexual Activity   Alcohol use:  Liquor 3 times a year, 1-2 drinks   Drug use: No   Current Outpatient Medications on File Prior to Visit  Medication Sig Dispense Refill   acetaminophen  (TYLENOL ) 500 MG tablet Take 1,000 mg by mouth every 6 (six) hours as needed for headache (pain).     albuterol  (VENTOLIN  HFA) 108 (90 Base) MCG/ACT inhaler Inhale 2 puffs into the lungs every 6 (six) hours as needed for wheezing or shortness of breath.     brexpiprazole (REXULTI) 1 MG TABS tablet Take 1 mg by mouth daily.     buPROPion (WELLBUTRIN XL) 300 MG 24 hr tablet Take 300 mg by mouth daily.     Cholecalciferol (VITAMIN D-3) 5000 units TABS Take 5,000 Units by mouth daily.     DULoxetine  (CYMBALTA ) 60 MG capsule Take 120 mg by mouth daily.     Estradiol-Norethindrone Acet 0.5-0.1 MG tablet Take 1 tablet by mouth daily.     hydrochlorothiazide  (HYDRODIURIL ) 25 MG tablet Take 25 mg by mouth daily.     HYDROcodone -acetaminophen  (NORCO/VICODIN) 5-325 MG tablet Take 1 tablet by mouth every 6 (six) hours as needed for moderate pain. 15 tablet 0   hydroxypropyl methylcellulose / hypromellose (ISOPTO TEARS / GONIOVISC) 2.5 % ophthalmic solution Place 1 drop into the left eye 4 (four) times daily as needed for dry eyes. 15 mL 12   ibuprofen (ADVIL,MOTRIN) 200 MG tablet Take 400-600 mg by mouth every 6 (six) hours as needed for headache (pain).     lisdexamfetamine  (VYVANSE ) 10 MG capsule Take 1 capsule by mouth daily 30 capsule 0   lisdexamfetamine  (VYVANSE ) 40 MG capsule Take 1 capsule (40 mg total) by mouth daily. 30 capsule 0   loratadine (CLARITIN) 10 MG tablet Take 10 mg by mouth daily as needed (pet allergies).      Loteprednol -Tobramycin  (ZYLET ) 0.5-0.3 % SUSP place 1 drop into the left eye 2 times daily taper as directed 5 mL 0   modafinil (PROVIGIL) 200 MG tablet Take 200 mg by mouth 2 (two) times daily. 1 in the morning and  Half in the afternoon.     Multiple Vitamin (MULTIVITAMIN WITH MINERALS) TABS tablet Take 1 tablet by mouth at bedtime.     NITROFURANTOIN  PO Take 100 mg by mouth daily.     oxyCODONE  (OXY IR/ROXICODONE )  5 MG immediate release tablet Take 1 tablet (5 mg total) by mouth every 6 (six) hours as needed for postoperative pain (date of surgery 06/10/24) for up to 3 days. 12 tablet 0   Polyethyl Glycol-Propyl Glycol (SYSTANE OP) Place 1 drop into both eyes daily as needed (dry eyes/ irritation).     potassium chloride  SA (K-DUR,KLOR-CON ) 20 MEQ tablet Take 20 mEq by mouth daily.     PRESCRIPTION MEDICATION Inhale into the lungs at bedtime. CPAP     rOPINIRole  (REQUIP ) 3 MG tablet Take 3 mg by mouth at bedtime.     simvastatin  (ZOCOR ) 20 MG tablet Take 20 mg by mouth at bedtime.     SYNTHROID  200 MCG tablet TAKE 1 TABLET BY MOUTH DAILY  BEFORE BREAKFAST 90 tablet 1   tirzepatide  (ZEPBOUND ) 10 MG/0.5ML Pen Inject 10 mg into the skin once a week. 2 mL 0   tirzepatide  (ZEPBOUND ) 12.5 MG/0.5ML Pen Inject 12.5 mg into the skin every 7 (seven) days. 2 mL 0   tirzepatide  (ZEPBOUND ) 15 MG/0.5ML Pen Inject 15 mg into the skin every 7 (seven) days. 6 mL 0   tirzepatide  (ZEPBOUND ) 15 MG/0.5ML Pen Inject 15 mg into the skin once a week. 2 mL 3   topiramate (TOPAMAX) 100 MG tablet Take 200 mg by mouth 2 (two) times daily.      valACYclovir  (VALTREX ) 1000 MG tablet Take 1 tablet (1,000 mg total) by mouth 3 (three) times daily. 21 tablet 0   valACYclovir  (VALTREX ) 1000 MG tablet take 1 tablet three times a day 42 tablet 0   zolpidem  (AMBIEN ) 10 MG tablet Take 10 mg by mouth at bedtime.     No current facility-administered medications on file prior to visit.   Allergies  Allergen Reactions   Accolate  [Zafirlukast] Hives   Aspirin Other (See Comments)    wheezing   Augmentin [Amoxicillin-Pot Clavulanate] Hives and Nausea And Vomiting   Codeine Nausea And Vomiting   Erythromycin Nausea And Vomiting   Lyrica [Pregabalin] Other (See Comments)    Suicidal thoughts    Peanut-Containing Drug Products Other (See Comments)    Wheezing    Family History  Problem Relation Age of Onset   Hypothyroidism Mother    Diabetes Father   Also,  heart disease in grandmother  diabetes in sister Lung cancer in grandfather  PE: BP 118/68   Pulse 72   Ht 5' 7.5 (1.715 m)   Wt 181 lb 9.6 oz (82.4 kg)   SpO2 98%   BMI 28.02 kg/m  Wt Readings from Last 3 Encounters:  09/19/24 181 lb 9.6 oz (82.4 kg)  09/20/23 237 lb 6.4 oz (107.7 kg)  03/20/23 231 lb 12.8 oz (105.1 kg)   Constitutional: in NAD, + full supraclavicular fat pads Eyes: PERRLA, EOMI, no exophthalmos ENT: no thyromegaly, no cervical lymphadenopathy Cardiovascular: RRR, No MRG Respiratory: CTA B Musculoskeletal: no deformities Skin: no rashes Neurological: + tremor with outstretched hands  ASSESSMENT: 1. Hypothyroidism due to Hashimoto's thyroiditis.  2.  Thyroid  nodules  3.  Prediabetes  PLAN:  1. Patient with longstanding autoimmune hypothyroidism, on levothyroxine  therapy - latest thyroid  labs reviewed with pt. >> TSH suppressed at last visit: Lab Results  Component Value Date   TSH 0.35 (L) 09/20/2023  - we decreased her levothyroxine  dose from 225 to 200 mcg daily at that time but she did not return for labs afterwards.  However, she had labs by PCP on 02/14/2024 and the TSH was normal, at  0.45. continues on LT4 200 mcg daily, dose decreased after the above results returned when she did not come back for repeat labs afterwards. - pt feels good on this dose.  We did discuss that due to her significant weight loss, we most likely need to continue to reduce her Synthroid  dose. - we discussed about taking the thyroid   hormone every day, with water, >30 minutes before breakfast, separated by >4 hours from acid reflux medications, calcium, iron, multivitamins. Pt. is taking it correctly. - will check thyroid  tests today: TSH and fT4 - If labs are abnormal, she will need to return for repeat TFTs in 1.5 months  2.  Thyroid  nodules -Per review of the thyroid  ultrasound report from 03/2020, she had a 1.7 cm right mid nodule, possibly developed from a previous cyst.  This was biopsied with inconclusive results (AUS) in 03/2020, however, the Afirma molecular marker returned benign. Another ultrasound (03/2022) showed a 1.7 cm right nodule that decreased in size to 1.4 cm, with no further follow-up recommended.  However, she also had a left thyroid  nodule (or pseudonodule), for which follow-up was recommended for 5 years. - We checked another ultrasound on 03/28/2023 and that showed stability of the nodules, with still recommended follow-up in a year to monitor the left thyroid  nodule. Will check this now. - She previously described occasional dysphagia, neck pressure, shortness of breath when turning head or raising arms, but this improved.  No neck compression symptoms now except some dysphagia, which is chronic for her.  3.  Prediabetes - She started Zepbound  for weight loss, which should also help with blood sugars - HbA1c level was mildly higher at last visit, 5.5%, but since then she obtained another HbA1c in 01/2024 and this was even lower, at 5.0%. - We discussed that prediabetes is reversible with weight loss and diet  Orders Placed This Encounter  Procedures   US  THYROID    TSH   T4, free  Needs refills to [facility] - 3 mo.  Component     Latest Ref Rng 09/19/2024  T4,Free(Direct)     0.8 - 1.8 ng/dL 1.6   TSH     9.59 - 5.49 mIU/L 0.28 (L)    TSH suppressed, most likely due to weight loss.  Will decrease the dose of her Synthroid  to 175 mcg daily and recheck her TFTs in 1.5 months.  Thyroid  U/S  (10/03/2024): Parenchymal Echotexture: Markedly heterogenous  Isthmus: 0.2 cm  Right lobe: 5.0 x 1.2 x 1.5 cm  Left lobe: 4.7 x 1 x 1 x 1.2 cm  _________________________________________________________   Estimated total number of nodules >/= 1 cm: 2 _________________________________________________________   Nodule 1: 1.7 x 1.2 x 1.1 cm RIGHT mid thyroid  nodule is unchanged in size since prior examination. Please correlate with prior FNA results from 03/18/2020.  _________________________________________________________   Nodule 2: 2.2 x 0.9 x 0.7 cm LEFT mid thyroid  nodules/pseudonodule is not significantly changed in size since prior ultrasound from 06/28/2017 which indicates a benign etiology.  _________________________________________________________   No new thyroid  nodules are seen.   IMPRESSION: 1. Markedly heterogeneous thyroid . 2. Previously biopsied RIGHT mid thyroid  nodule is unchanged in size. Please correlate with prior FNA results from 03/18/2020. 3. LEFT mid thyroid  nodules/pseudonodule demonstrates stability since 06/28/2017, consistent with benign etiology. 4. No new thyroid  nodules.  Lela Fendt, MD PhD [facility] Endocrinology

## 2024-09-19 NOTE — Patient Instructions (Signed)
 Please continue Synthroid   200 mcg daily.  Take the thyroid  hormone every day, with water, at least 30 minutes before breakfast, separated by at least 4 hours from: - acid reflux medications - calcium - iron - multivitamins  Please stop at the lab.  Please return in 1 year.

## 2024-09-20 LAB — T4, FREE: Free T4: 1.6 ng/dL (ref 0.8–1.8)

## 2024-09-20 LAB — TSH: TSH: 0.28 m[IU]/L — ABNORMAL LOW (ref 0.40–4.50)

## 2024-09-22 ENCOUNTER — Ambulatory Visit: Payer: Self-pay | Admitting: Internal Medicine

## 2024-09-22 MED ORDER — SYNTHROID 175 MCG PO TABS: 175.0000 ug | ORAL_TABLET | Freq: Every day | ORAL | 3 refills | Status: AC

## 2024-09-22 NOTE — Addendum Note (Signed)
 Addended by: TRIXIE FILE on: 09/22/2024 01:57 PM   Modules accepted: Orders

## 2024-09-23 ENCOUNTER — Other Ambulatory Visit (HOSPITAL_BASED_OUTPATIENT_CLINIC_OR_DEPARTMENT_OTHER): Payer: Self-pay

## 2024-09-23 ENCOUNTER — Other Ambulatory Visit (HOSPITAL_COMMUNITY): Payer: Self-pay

## 2024-09-23 DIAGNOSIS — R29898 Other symptoms and signs involving the musculoskeletal system: Secondary | ICD-10-CM

## 2024-09-23 DIAGNOSIS — M7502 Adhesive capsulitis of left shoulder: Secondary | ICD-10-CM

## 2024-09-23 DIAGNOSIS — M542 Cervicalgia: Secondary | ICD-10-CM

## 2024-09-23 DIAGNOSIS — M25512 Pain in left shoulder: Secondary | ICD-10-CM

## 2024-09-23 DIAGNOSIS — M25612 Stiffness of left shoulder, not elsewhere classified: Secondary | ICD-10-CM

## 2024-09-23 MED ORDER — METHOCARBAMOL 500 MG PO TABS
500.0000 mg | ORAL_TABLET | Freq: Three times a day (TID) | ORAL | 1 refills | Status: AC | PRN
  Filled 2024-09-23: qty 40, 14d supply, fill #0

## 2024-09-23 MED ORDER — PREDNISONE 20 MG PO TABS
ORAL_TABLET | ORAL | 0 refills | Status: AC
  Filled 2024-09-23: qty 10, 7d supply, fill #0

## 2024-09-24 ENCOUNTER — Other Ambulatory Visit (HOSPITAL_BASED_OUTPATIENT_CLINIC_OR_DEPARTMENT_OTHER): Payer: Self-pay

## 2024-09-26 ENCOUNTER — Ambulatory Visit (HOSPITAL_COMMUNITY)
Admission: RE | Admit: 2024-09-26 | Discharge: 2024-09-26 | Disposition: A | Discharge status: Home / Self Care | Source: Ambulatory Visit

## 2024-09-26 DIAGNOSIS — M25512 Pain in left shoulder: Secondary | ICD-10-CM

## 2024-09-26 DIAGNOSIS — M542 Cervicalgia: Secondary | ICD-10-CM | POA: Insufficient documentation

## 2024-09-26 DIAGNOSIS — R29898 Other symptoms and signs involving the musculoskeletal system: Secondary | ICD-10-CM

## 2024-09-26 DIAGNOSIS — M7502 Adhesive capsulitis of left shoulder: Secondary | ICD-10-CM

## 2024-09-26 DIAGNOSIS — M25612 Stiffness of left shoulder, not elsewhere classified: Secondary | ICD-10-CM

## 2024-10-03 ENCOUNTER — Ambulatory Visit
Admission: RE | Admit: 2024-10-03 | Discharge: 2024-10-03 | Disposition: A | Discharge status: Home / Self Care | Source: Ambulatory Visit | Attending: Internal Medicine | Admitting: Internal Medicine

## 2024-10-03 DIAGNOSIS — E041 Nontoxic single thyroid nodule: Secondary | ICD-10-CM

## 2024-10-08 ENCOUNTER — Other Ambulatory Visit (HOSPITAL_BASED_OUTPATIENT_CLINIC_OR_DEPARTMENT_OTHER): Payer: Self-pay

## 2024-10-08 MED ORDER — METHOCARBAMOL 500 MG PO TABS
500.0000 mg | ORAL_TABLET | Freq: Three times a day (TID) | ORAL | 1 refills | Status: AC | PRN
  Filled 2024-10-08: qty 40, 14d supply, fill #0

## 2024-10-21 ENCOUNTER — Other Ambulatory Visit (HOSPITAL_BASED_OUTPATIENT_CLINIC_OR_DEPARTMENT_OTHER): Payer: Self-pay

## 2024-10-21 MED ORDER — ZOLPIDEM TARTRATE 10 MG PO TABS
10.0000 mg | ORAL_TABLET | Freq: Every evening | ORAL | 0 refills | Status: AC | PRN
  Filled 2024-10-21: qty 30, 30d supply, fill #0

## 2025-09-21 ENCOUNTER — Ambulatory Visit: Admitting: Internal Medicine
